# Patient Record
Sex: Female | Born: 1980
Health system: Southern US, Community
[De-identification: ages and names within clinical notes are randomized; demographics above are authoritative.]

## PROBLEM LIST (undated history)

## (undated) ENCOUNTER — Inpatient Hospital Stay (HOSPITAL_COMMUNITY): Payer: Self-pay

## (undated) DIAGNOSIS — T7840XA Allergy, unspecified, initial encounter: Secondary | ICD-10-CM

## (undated) DIAGNOSIS — E079 Disorder of thyroid, unspecified: Secondary | ICD-10-CM

## (undated) DIAGNOSIS — N6029 Fibroadenosis of unspecified breast: Secondary | ICD-10-CM

## (undated) DIAGNOSIS — G43009 Migraine without aura, not intractable, without status migrainosus: Secondary | ICD-10-CM

## (undated) DIAGNOSIS — D249 Benign neoplasm of unspecified breast: Secondary | ICD-10-CM

## (undated) HISTORY — DX: Allergy, unspecified, initial encounter: T78.40XA

## (undated) HISTORY — PX: WISDOM TOOTH EXTRACTION: SHX21

## (undated) HISTORY — DX: Benign neoplasm of unspecified breast: D24.9

## (undated) HISTORY — DX: Fibroadenosis of unspecified breast: N60.29

## (undated) HISTORY — DX: Disorder of thyroid, unspecified: E07.9

## (undated) HISTORY — DX: Migraine without aura, not intractable, without status migrainosus: G43.009

---

## 1999-07-03 ENCOUNTER — Other Ambulatory Visit: Admission: RE | Admit: 1999-07-03 | Discharge: 1999-07-03 | Payer: Self-pay | Admitting: Internal Medicine

## 2000-07-02 ENCOUNTER — Other Ambulatory Visit: Admission: RE | Admit: 2000-07-02 | Discharge: 2000-07-02 | Payer: Self-pay | Admitting: Family Medicine

## 2001-07-11 ENCOUNTER — Other Ambulatory Visit: Admission: RE | Admit: 2001-07-11 | Discharge: 2001-07-11 | Payer: Self-pay | Admitting: Family Medicine

## 2002-06-30 ENCOUNTER — Other Ambulatory Visit: Admission: RE | Admit: 2002-06-30 | Discharge: 2002-06-30 | Payer: Self-pay | Admitting: Internal Medicine

## 2003-07-02 ENCOUNTER — Other Ambulatory Visit: Admission: RE | Admit: 2003-07-02 | Discharge: 2003-07-02 | Payer: Self-pay | Admitting: Internal Medicine

## 2004-10-16 ENCOUNTER — Encounter (INDEPENDENT_AMBULATORY_CARE_PROVIDER_SITE_OTHER): Payer: Self-pay | Admitting: Internal Medicine

## 2004-10-16 LAB — CONVERTED CEMR LAB: Pap Smear: NORMAL

## 2004-11-06 ENCOUNTER — Ambulatory Visit: Payer: Self-pay | Admitting: Family Medicine

## 2004-11-06 ENCOUNTER — Other Ambulatory Visit: Admission: RE | Admit: 2004-11-06 | Discharge: 2004-11-06 | Payer: Self-pay | Admitting: Internal Medicine

## 2005-01-08 ENCOUNTER — Ambulatory Visit: Payer: Self-pay | Admitting: Family Medicine

## 2005-05-05 ENCOUNTER — Encounter: Admission: RE | Admit: 2005-05-05 | Discharge: 2005-05-05 | Payer: Self-pay | Admitting: Unknown Physician Specialty

## 2005-12-17 ENCOUNTER — Encounter (INDEPENDENT_AMBULATORY_CARE_PROVIDER_SITE_OTHER): Payer: Self-pay | Admitting: Internal Medicine

## 2005-12-23 ENCOUNTER — Other Ambulatory Visit: Admission: RE | Admit: 2005-12-23 | Discharge: 2005-12-23 | Payer: Self-pay | Admitting: Family Medicine

## 2005-12-23 ENCOUNTER — Ambulatory Visit: Payer: Self-pay | Admitting: Family Medicine

## 2006-12-17 ENCOUNTER — Encounter (INDEPENDENT_AMBULATORY_CARE_PROVIDER_SITE_OTHER): Payer: Self-pay | Admitting: Internal Medicine

## 2007-01-10 ENCOUNTER — Other Ambulatory Visit: Admission: RE | Admit: 2007-01-10 | Discharge: 2007-01-10 | Payer: Self-pay | Admitting: Family Medicine

## 2007-01-10 ENCOUNTER — Ambulatory Visit: Payer: Self-pay | Admitting: Family Medicine

## 2007-01-10 ENCOUNTER — Encounter (INDEPENDENT_AMBULATORY_CARE_PROVIDER_SITE_OTHER): Payer: Self-pay | Admitting: Internal Medicine

## 2007-03-04 ENCOUNTER — Ambulatory Visit: Payer: Self-pay | Admitting: Family Medicine

## 2007-04-27 ENCOUNTER — Encounter: Payer: Self-pay | Admitting: Internal Medicine

## 2007-04-27 DIAGNOSIS — N6029 Fibroadenosis of unspecified breast: Secondary | ICD-10-CM | POA: Insufficient documentation

## 2007-04-27 DIAGNOSIS — E039 Hypothyroidism, unspecified: Secondary | ICD-10-CM | POA: Insufficient documentation

## 2007-04-27 HISTORY — DX: Fibroadenosis of unspecified breast: N60.29

## 2007-04-29 ENCOUNTER — Ambulatory Visit: Payer: Self-pay | Admitting: Family Medicine

## 2007-04-29 DIAGNOSIS — Z888 Allergy status to other drugs, medicaments and biological substances status: Secondary | ICD-10-CM | POA: Insufficient documentation

## 2007-05-25 ENCOUNTER — Telehealth (INDEPENDENT_AMBULATORY_CARE_PROVIDER_SITE_OTHER): Payer: Self-pay | Admitting: Internal Medicine

## 2007-06-09 ENCOUNTER — Encounter: Payer: Self-pay | Admitting: Family Medicine

## 2007-07-28 ENCOUNTER — Encounter (INDEPENDENT_AMBULATORY_CARE_PROVIDER_SITE_OTHER): Payer: Self-pay | Admitting: Internal Medicine

## 2007-08-26 ENCOUNTER — Ambulatory Visit: Payer: Self-pay | Admitting: Family Medicine

## 2007-09-09 ENCOUNTER — Telehealth (INDEPENDENT_AMBULATORY_CARE_PROVIDER_SITE_OTHER): Payer: Self-pay | Admitting: *Deleted

## 2007-10-25 ENCOUNTER — Encounter: Admission: RE | Admit: 2007-10-25 | Discharge: 2007-10-25 | Payer: Self-pay | Admitting: Unknown Physician Specialty

## 2008-01-19 ENCOUNTER — Encounter (INDEPENDENT_AMBULATORY_CARE_PROVIDER_SITE_OTHER): Payer: Self-pay | Admitting: Internal Medicine

## 2008-01-19 ENCOUNTER — Ambulatory Visit: Payer: Self-pay | Admitting: Family Medicine

## 2008-01-19 ENCOUNTER — Other Ambulatory Visit: Admission: RE | Admit: 2008-01-19 | Discharge: 2008-01-19 | Payer: Self-pay | Admitting: Family Medicine

## 2008-01-19 LAB — CONVERTED CEMR LAB: Pap Smear: NORMAL

## 2008-01-27 ENCOUNTER — Encounter (INDEPENDENT_AMBULATORY_CARE_PROVIDER_SITE_OTHER): Payer: Self-pay | Admitting: Internal Medicine

## 2008-01-27 ENCOUNTER — Encounter (INDEPENDENT_AMBULATORY_CARE_PROVIDER_SITE_OTHER): Payer: Self-pay | Admitting: *Deleted

## 2008-06-22 ENCOUNTER — Encounter (INDEPENDENT_AMBULATORY_CARE_PROVIDER_SITE_OTHER): Payer: Self-pay | Admitting: Internal Medicine

## 2008-06-25 ENCOUNTER — Encounter (INDEPENDENT_AMBULATORY_CARE_PROVIDER_SITE_OTHER): Payer: Self-pay | Admitting: *Deleted

## 2008-06-25 ENCOUNTER — Encounter (INDEPENDENT_AMBULATORY_CARE_PROVIDER_SITE_OTHER): Payer: Self-pay | Admitting: Internal Medicine

## 2008-06-25 DIAGNOSIS — N63 Unspecified lump in unspecified breast: Secondary | ICD-10-CM | POA: Insufficient documentation

## 2008-07-10 ENCOUNTER — Telehealth: Payer: Self-pay | Admitting: Family Medicine

## 2008-10-30 ENCOUNTER — Telehealth (INDEPENDENT_AMBULATORY_CARE_PROVIDER_SITE_OTHER): Payer: Self-pay | Admitting: Internal Medicine

## 2009-01-23 ENCOUNTER — Ambulatory Visit: Payer: Self-pay | Admitting: Family Medicine

## 2009-01-23 ENCOUNTER — Other Ambulatory Visit: Admission: RE | Admit: 2009-01-23 | Discharge: 2009-01-23 | Payer: Self-pay | Admitting: Family Medicine

## 2009-01-23 ENCOUNTER — Encounter (INDEPENDENT_AMBULATORY_CARE_PROVIDER_SITE_OTHER): Payer: Self-pay | Admitting: Internal Medicine

## 2009-01-24 LAB — CONVERTED CEMR LAB
BUN: 11 mg/dL (ref 6–23)
Basophils Absolute: 0 10*3/uL (ref 0.0–0.1)
Basophils Relative: 0 % (ref 0–1)
CO2: 24 meq/L (ref 19–32)
Calcium: 8.8 mg/dL (ref 8.4–10.5)
Eosinophils Absolute: 0.1 10*3/uL (ref 0.0–0.7)
Eosinophils Relative: 2 % (ref 0–5)
Glucose, Bld: 79 mg/dL (ref 70–99)
HCT: 41.8 % (ref 36.0–46.0)
MCHC: 33.5 g/dL (ref 30.0–36.0)
MCV: 93.1 fL (ref 78.0–100.0)
Monocytes Absolute: 0.4 10*3/uL (ref 0.1–1.0)
Monocytes Relative: 7 % (ref 3–12)
Neutro Abs: 3.3 10*3/uL (ref 1.7–7.7)
Platelets: 293 10*3/uL (ref 150–400)
RDW: 13.3 % (ref 11.5–15.5)
TSH: 2.402 microintl units/mL (ref 0.350–4.500)

## 2009-01-29 ENCOUNTER — Encounter (INDEPENDENT_AMBULATORY_CARE_PROVIDER_SITE_OTHER): Payer: Self-pay | Admitting: *Deleted

## 2009-07-03 ENCOUNTER — Ambulatory Visit: Payer: Self-pay | Admitting: Obstetrics & Gynecology

## 2009-07-03 LAB — CONVERTED CEMR LAB: GC Probe Amp, Genital: NEGATIVE

## 2009-07-04 ENCOUNTER — Encounter: Payer: Self-pay | Admitting: Obstetrics & Gynecology

## 2010-01-23 ENCOUNTER — Ambulatory Visit: Payer: Self-pay | Admitting: Family Medicine

## 2010-01-23 LAB — CONVERTED CEMR LAB
TSH: 1.945 microintl units/mL (ref 0.350–4.500)
Vit D, 25-Hydroxy: 17 ng/mL — ABNORMAL LOW (ref 30–89)

## 2010-06-12 ENCOUNTER — Ambulatory Visit: Payer: Self-pay | Admitting: Obstetrics and Gynecology

## 2010-06-12 LAB — CONVERTED CEMR LAB: GC Probe Amp, Genital: NEGATIVE

## 2010-06-13 ENCOUNTER — Encounter: Payer: Self-pay | Admitting: Obstetrics and Gynecology

## 2010-06-13 LAB — CONVERTED CEMR LAB
Clue Cells Wet Prep HPF POC: NONE SEEN
Trich, Wet Prep: NONE SEEN

## 2010-06-18 ENCOUNTER — Ambulatory Visit (HOSPITAL_COMMUNITY): Admission: RE | Admit: 2010-06-18 | Discharge: 2010-06-18 | Payer: Self-pay | Admitting: Family Medicine

## 2010-06-23 ENCOUNTER — Encounter: Payer: Self-pay | Admitting: Family Medicine

## 2010-06-23 ENCOUNTER — Ambulatory Visit: Payer: Self-pay | Admitting: Obstetrics and Gynecology

## 2010-06-23 LAB — CONVERTED CEMR LAB
Eosinophils Absolute: 0.1 10*3/uL (ref 0.0–0.7)
Eosinophils Relative: 2 % (ref 0–5)
HCT: 41.5 % (ref 36.0–46.0)
Hemoglobin: 13.4 g/dL (ref 12.0–15.0)
Hepatitis B Surface Ag: NEGATIVE
Lymphocytes Relative: 25 % (ref 12–46)
Lymphs Abs: 1.8 10*3/uL (ref 0.7–4.0)
MCV: 97.9 fL (ref 78.0–100.0)
Monocytes Absolute: 0.4 10*3/uL (ref 0.1–1.0)
Platelets: 233 10*3/uL (ref 150–400)
RDW: 14.1 % (ref 11.5–15.5)
Rh Type: NEGATIVE
WBC: 7.4 10*3/uL (ref 4.0–10.5)

## 2010-07-10 ENCOUNTER — Encounter: Payer: Self-pay | Admitting: Family Medicine

## 2010-07-10 ENCOUNTER — Ambulatory Visit: Payer: Self-pay | Admitting: Obstetrics & Gynecology

## 2010-07-10 LAB — CONVERTED CEMR LAB
Chlamydia, Swab/Urine, PCR: NEGATIVE
TSH: 2.01 microintl units/mL (ref 0.350–4.500)

## 2010-08-07 ENCOUNTER — Ambulatory Visit: Payer: Self-pay | Admitting: Obstetrics & Gynecology

## 2010-08-07 ENCOUNTER — Encounter: Payer: Self-pay | Admitting: Family Medicine

## 2010-08-25 ENCOUNTER — Ambulatory Visit (HOSPITAL_COMMUNITY): Admission: RE | Admit: 2010-08-25 | Discharge: 2010-08-25 | Payer: Self-pay | Admitting: Obstetrics & Gynecology

## 2010-09-11 ENCOUNTER — Ambulatory Visit: Payer: Self-pay | Admitting: Obstetrics & Gynecology

## 2010-09-25 ENCOUNTER — Ambulatory Visit: Payer: Self-pay | Admitting: Obstetrics & Gynecology

## 2010-09-26 ENCOUNTER — Encounter: Payer: Self-pay | Admitting: Family Medicine

## 2010-10-14 ENCOUNTER — Ambulatory Visit: Payer: Self-pay | Admitting: Obstetrics & Gynecology

## 2010-11-04 ENCOUNTER — Ambulatory Visit: Payer: Self-pay | Admitting: Family Medicine

## 2010-11-04 ENCOUNTER — Encounter: Payer: Self-pay | Admitting: Family Medicine

## 2010-11-04 LAB — CONVERTED CEMR LAB
HIV: NONREACTIVE
MCHC: 32 g/dL (ref 30.0–36.0)
MCV: 96.2 fL (ref 78.0–100.0)
Platelets: 296 10*3/uL (ref 150–400)
RBC: 4.16 M/uL (ref 3.87–5.11)
RDW: 13.4 % (ref 11.5–15.5)
TSH: 2.42 microintl units/mL (ref 0.350–4.500)

## 2010-11-06 ENCOUNTER — Ambulatory Visit: Payer: Self-pay | Admitting: Obstetrics & Gynecology

## 2010-11-20 ENCOUNTER — Ambulatory Visit
Admission: RE | Admit: 2010-11-20 | Discharge: 2010-11-20 | Payer: Self-pay | Source: Home / Self Care | Attending: Obstetrics & Gynecology | Admitting: Obstetrics & Gynecology

## 2010-11-27 ENCOUNTER — Ambulatory Visit
Admission: RE | Admit: 2010-11-27 | Discharge: 2010-11-27 | Payer: Self-pay | Source: Home / Self Care | Attending: Obstetrics & Gynecology | Admitting: Obstetrics & Gynecology

## 2010-12-11 ENCOUNTER — Ambulatory Visit
Admission: RE | Admit: 2010-12-11 | Discharge: 2010-12-11 | Payer: Self-pay | Source: Home / Self Care | Attending: Obstetrics & Gynecology | Admitting: Obstetrics & Gynecology

## 2010-12-25 ENCOUNTER — Encounter: Payer: Self-pay | Admitting: Family Medicine

## 2010-12-25 ENCOUNTER — Encounter: Payer: Self-pay | Admitting: Obstetrics & Gynecology

## 2010-12-25 ENCOUNTER — Encounter: Payer: BC Managed Care – PPO | Admitting: Obstetrics & Gynecology

## 2010-12-25 DIAGNOSIS — Z3480 Encounter for supervision of other normal pregnancy, unspecified trimester: Secondary | ICD-10-CM

## 2010-12-25 DIAGNOSIS — O99280 Endocrine, nutritional and metabolic diseases complicating pregnancy, unspecified trimester: Secondary | ICD-10-CM

## 2010-12-25 DIAGNOSIS — E079 Disorder of thyroid, unspecified: Secondary | ICD-10-CM

## 2010-12-26 ENCOUNTER — Encounter: Payer: Self-pay | Admitting: Family Medicine

## 2011-01-01 ENCOUNTER — Encounter: Payer: BC Managed Care – PPO | Admitting: Obstetrics & Gynecology

## 2011-01-01 DIAGNOSIS — Z3480 Encounter for supervision of other normal pregnancy, unspecified trimester: Secondary | ICD-10-CM

## 2011-01-07 ENCOUNTER — Encounter: Payer: BC Managed Care – PPO | Admitting: Obstetrics & Gynecology

## 2011-01-07 DIAGNOSIS — Z3400 Encounter for supervision of normal first pregnancy, unspecified trimester: Secondary | ICD-10-CM

## 2011-01-14 ENCOUNTER — Encounter: Payer: BC Managed Care – PPO | Admitting: Obstetrics & Gynecology

## 2011-01-14 DIAGNOSIS — Z3400 Encounter for supervision of normal first pregnancy, unspecified trimester: Secondary | ICD-10-CM

## 2011-01-14 DIAGNOSIS — O368190 Decreased fetal movements, unspecified trimester, not applicable or unspecified: Secondary | ICD-10-CM

## 2011-01-22 ENCOUNTER — Encounter: Payer: BC Managed Care – PPO | Admitting: Obstetrics & Gynecology

## 2011-01-22 DIAGNOSIS — Z3400 Encounter for supervision of normal first pregnancy, unspecified trimester: Secondary | ICD-10-CM

## 2011-01-26 ENCOUNTER — Encounter: Payer: BC Managed Care – PPO | Admitting: Obstetrics & Gynecology

## 2011-01-26 ENCOUNTER — Inpatient Hospital Stay (HOSPITAL_COMMUNITY)
Admission: AD | Admit: 2011-01-26 | Discharge: 2011-01-30 | DRG: 371 | Disposition: A | Discharge status: Home / Self Care | Payer: BC Managed Care – PPO | Source: Ambulatory Visit | Attending: Family Medicine | Admitting: Family Medicine

## 2011-01-26 DIAGNOSIS — O99892 Other specified diseases and conditions complicating childbirth: Secondary | ICD-10-CM | POA: Diagnosis present

## 2011-01-26 DIAGNOSIS — Z22330 Carrier of Group B streptococcus: Secondary | ICD-10-CM

## 2011-01-26 DIAGNOSIS — O4290 Premature rupture of membranes, unspecified as to length of time between rupture and onset of labor, unspecified weeks of gestation: Secondary | ICD-10-CM | POA: Diagnosis present

## 2011-01-26 LAB — CBC
HCT: 38.7 % (ref 36.0–46.0)
Hemoglobin: 12.9 g/dL (ref 12.0–15.0)
MCH: 30.3 pg (ref 26.0–34.0)
MCHC: 33.3 g/dL (ref 30.0–36.0)
MCV: 90.8 fL (ref 78.0–100.0)
RDW: 14.3 % (ref 11.5–15.5)

## 2011-01-26 LAB — COMPREHENSIVE METABOLIC PANEL
ALT: 15 U/L (ref 0–35)
BUN: 7 mg/dL (ref 6–23)
CO2: 21 mEq/L (ref 19–32)
Calcium: 8.4 mg/dL (ref 8.4–10.5)
Creatinine, Ser: 0.6 mg/dL (ref 0.4–1.2)
GFR calc non Af Amer: >60 mL/min (ref >60)
Glucose, Bld: 93 mg/dL (ref 70–99)
Sodium: 134 mEq/L — ABNORMAL LOW (ref 135–145)
Total Protein: 6.6 g/dL (ref 6.0–8.3)

## 2011-01-26 LAB — RPR: RPR Ser Ql: NONREACTIVE

## 2011-01-27 DIAGNOSIS — O99892 Other specified diseases and conditions complicating childbirth: Secondary | ICD-10-CM

## 2011-01-27 DIAGNOSIS — O4290 Premature rupture of membranes, unspecified as to length of time between rupture and onset of labor, unspecified weeks of gestation: Secondary | ICD-10-CM

## 2011-01-27 DIAGNOSIS — O9989 Other specified diseases and conditions complicating pregnancy, childbirth and the puerperium: Secondary | ICD-10-CM

## 2011-01-28 LAB — CBC
Hemoglobin: 10.2 g/dL — ABNORMAL LOW (ref 12.0–15.0)
MCHC: 32.5 g/dL (ref 30.0–36.0)
RDW: 15 % (ref 11.5–15.5)
WBC: 10.8 10*3/uL — ABNORMAL HIGH (ref 4.0–10.5)

## 2011-01-29 LAB — RH IMMUNE GLOB WKUP(>/=20WKS)(NOT WOMEN'S HOSP)
Fetal Screen: NEGATIVE
Unit division: 0

## 2011-02-03 NOTE — Op Note (Signed)
NAME:  CHIANNA, SPIRITO              ACCOUNT NO.:  [id number]  MEDICAL RECORD NO.:  [id number]           PATIENT TYPE:  I  LOCATION:  9126                          FACILITY:  [facility]  PHYSICIAN:  Catalina Antigua, MD     DATE OF BIRTH:  1981/05/12  DATE OF PROCEDURE: DATE OF DISCHARGE:                              OPERATIVE REPORT   PREOPERATIVE DIAGNOSIS:  A 30 year old gravida 1 at 40 weeks and 2 days with premature rupture of membranes, for primary C-section secondary to failure to dilate.  POSTOPERATIVE DIAGNOSIS:  A 30 year old gravida 1 at 40 weeks and 2 days with premature rupture of membranes, for primary C-section secondary to failure to dilate.  PROCEDURE:  Primary low transverse C-section.  SURGEON:  Catalina Antigua, MD.  ASSISTANT:  None.  ANESTHESIA:  Epidural.  IV FLUIDS:  2000 mL.  URINE OUTPUT:  300 mL.  ESTIMATED BLOOD LOSS:  600 mL.  COMPLICATIONS:  None.  FINDINGS:  A viable female infant with Apgars 9 and 9, weight of 8 pounds 5 ounces; grossly normal uterus, tubes, and ovaries x2.  Specimen collected was placenta and was sent to [facility] and [facility].  After informed consent was obtained, the patient was taken to the operating room where anesthesia was induced and found to be adequate. The patient was placed in dorsal supine position and prepped and draped in usual sterile fashion.  A Pfannenstiel skin incision was made with a scalpel and carried down the underlying layer of fascia with the Bovie. The fascia was incised in the midline.  Incision extended laterally with Mayo scissors.  The inferior aspect of the fascial incision was grasped with Kocher clamps, tented up, and the underlying rectus muscle dissected off.  Attention was then to the superior aspect of fascial incision which in similar fashion was grasped, tented up with Kocher clamps, and the underlying rectus muscle dissected off.  The rectus muscle was separated in the midline.  Peritoneum identified,  tented up, and entered bluntly.  This incision was extended superiorly and inferiorly with good visualization of the bladder.  The Alexis self- retaining retractor was introduced into the abdominal cavity.  The vesicouterine peritoneum was identified, tented up, and entered sharply with Metzenbaum scissors.  This incision was extended laterally and a bladder flap created digitally.  The lower uterine segment was incised in a transverse fashion with a scalpel.  This incision was extended laterally with the bandage scissors.  The infant was delivered atraumatically.  The nose and mouth were bulb suctioned.  Cord clamped and cut.  The infant was handed to awaiting pediatrician.  The placenta was delivered via gentle uterine massage.  The uterus was cleared of all clots and debris.  The uterine incision was repaired with 0 Vicryl in a running locked fashion.  Excellent hemostasis was noted.  The gutters were cleared of all clots and debris.  Copious irrigation of the abdomen was then performed.  The fascia was reapproximated with 0 Vicryl, and the skin was closed with staples.  The patient tolerated the procedure well.  Sponge, lap, and needle counts were correct x2.  Catalina Antigua, MD     PC/MEDQ  D:  01/27/2011  T:  01/27/2011  Job:  [id number]  Electronically Signed by Catalina Antigua  on 02/03/2011 02:06:31 PM

## 2011-02-14 NOTE — Discharge Summary (Signed)
  NAMEJESSE, HIRST              ACCOUNT NO.:  [id number]  MEDICAL RECORD NO.:  [id number]           PATIENT TYPE:  I  LOCATION:  9126                          FACILITY:  [facility]  PHYSICIAN:  Italia Wolfert S. Shawnie Pons, M.D.   DATE OF BIRTH:  Mar 17, 1981  DATE OF ADMISSION:  01/26/2011 DATE OF DISCHARGE:  01/30/2011                              DISCHARGE SUMMARY   DISCHARGE DIAGNOSES: 1. Pregnancy status post low transverse cesarean section delivery. 2. Failure to dilate the cervix.  DISCHARGE MEDICATIONS: 1. Percocet 5/325 mg p.o. q.6 p.r.n. pain. 2. Ibuprofen 600 mg p.o. q.6 p.r.n. pain. 3. Docusate 100 mg p.o. b.i.d. p.r.n. constipation. 4. Prenatal vitamin.  LABORATORY DATA:  Pertinent lab values, on January 26, 2011, CBC white blood cell count 10.3, hemoglobin 12.9, hematocrit 38.7, and platelets 251.  Complete metabolic panel, sodium 139, potassium 3.9, chloride 105, CO2 of 21, glucose 93, BUN 7, creatinine 0.67, total bilirubin 0.7, alkaline phosphatase 102, AST 22, ALT 15, total protein 6.6, albumin 2.7, calcium 8.4, RPR was nonreactive.  January 28, 2011, CBC white blood cell count 10.8, hemoglobin 10.2, hematocrit 31.4, platelets 183.  PROCEDURES:  Low transverse cesarean section.  BRIEF HOSPITAL COURSE:  Jan Walters is a 30 year old G1 who presented with rupture of membranes at 41 weeks.  The patient was admitted to birthing suites.   However, her cervix failed to dialate, so she was taken to the operating room for low transverse cesarean section.  There were no complications to the C-section.  The patient was afebrile with stable vital signs and stable hemoglobin.  On postop days 1, 2, and 3, her pain was well-controlled with Percocet and ibuprofen.  She was breast-feeding her baby.  She opted not to have any immediate birth control but plans to have a Mirena placed at her postpartum visit.  On the day of discharge, the patient's staples were removed.  Her incision was dry and  no drainage, no bleeding.  Steri-Strips were placed after removal of staples.  The incision was healing well.  FOLLOWUP ISSUES/RECOMMENDATIONS:  The patient will follow up at [facility] in 6 weeks for her postpartum visit.  DISCHARGE CONDITION:  The patient was discharged home in stable medical condition.    ______________________________ Ardyth Gal, MD   ______________________________ Shelbie Proctor. Shawnie Pons, M.D.    CR/MEDQ  D:  01/30/2011  T:  01/31/2011  Job:  [id number]  Electronically Signed by Ardyth Gal MD on 02/11/2011 04:30:59 PM Electronically Signed by Tinnie Gens M.D. on 02/14/2011 06:38:08 PM

## 2011-03-05 ENCOUNTER — Ambulatory Visit: Payer: BC Managed Care – PPO | Admitting: Obstetrics & Gynecology

## 2011-03-06 NOTE — Assessment & Plan Note (Signed)
NAME:  Catherine Mckenzie, Catherine Mckenzie NO.:  [id number]  MEDICAL RECORD NO.:  [id number]           PATIENT TYPE:  I  LOCATION:  [facility] at [facility]          FACILITY:  [facility]  PHYSICIAN:  Tinnie Gens, MD        DATE OF BIRTH:  1981/07/16  DATE OF SERVICE:  03/05/2011                                 CLINIC NOTE  CHIEF COMPLAINT:  Postpartum check.  HISTORY OF PRESENT ILLNESS:  The patient is a 30 year old gravida 1, para 1 who ended up with a cesarean delivery secondary to right occiput posterior arrest of dilation of 8 pounds 5 ounces female fetus, who at this time is well.  She is breast-feeding that is going well.  She reports feeling better over the last week, has recovery from her cesarean delivery.  She reports that her mood is really good.  She has no issues there.  She reports painful breastfeeding, feels like a knife stabbing her when the baby latches on.  She has no thrush.  She was seen by the doctor yesterday.  She has no other complaints.  She has not resumed sexual activity.  PHYSICAL EXAMINATION:  VITAL SIGNS:  Her vitals are as noted in the chart. GENERAL:  She is a well-developed, well-nourished female, in no acute distress. ABDOMEN:  Soft, nontender, nondistended.  Incision is well healed. BREASTS:  Revealed crack pink nipples consistent with deep fungal infection. GU:  Normal external female genitalia.  BUS is normal.  Vagina is pink and rugated.  Cervix is without lesion.  The uterus is small and well involuted, nontender.  IMPRESSION: 1. Postpartum check. 2. Status post Cesarean section. 3. Candidal breast infection.  PLAN: 1. Continue breast-feeding.  We will put her on Diflucan 150 mg p.o.     daily. 2. Start wearing breast shells again as this will probably help her     nipple stay dry. 3. The patient desires mini-pill for birth control.  We will start     that one p.o. daily x1 pack.  The patient's Pap is due at her next     yearly visit.        ______________________________ Tinnie Gens, MD    TP/MEDQ  D:  03/05/2011  T:  03/05/2011  Job:  [id number]

## 2011-03-31 NOTE — Assessment & Plan Note (Signed)
NAME:  Catherine Mckenzie, Catherine Mckenzie              ACCOUNT NO.:  [id number]   MEDICAL RECORD NO.:  [id number]          PATIENT TYPE:  POB   LOCATION:  [facility] at [facility]         FACILITY:  [facility]   PHYSICIAN:  Tinnie Gens, MD        DATE OF BIRTH:  [birth year]   DATE OF SERVICE:  01/23/2010                                  CLINIC NOTE   CHIEF COMPLAINT:  Physical exam.   HISTORY OF PRESENT ILLNESS:  The patient is a 30 year old nullipara who  comes in today for her yearly exam.  She was last seen in August of last  year with some pelvic pain that has since resolved mostly.  The patient  is off her birth control just this past month, so that she could try to  achieve pregnancy.  She is on prenatal vitamins and taking them  regularly.  She has been on birth control pills for approximately 11  years.  Before that, she had a lot of problems with migraine headaches  and painful cycles.  Since she has not had a regular cycle yet to know  how she will do since stopping her birth control.   PAST MEDICAL HISTORY:  Significant for hypothyroidism, migraine  headaches, and seasonal allergies.   PAST SURGICAL HISTORY:  Wisdom teeth removal.   MEDICATIONS:  Allergy shots weekly, Synthroid 25 mcg p.o. daily, Zyrtec  10 mg one p.o. daily.  She varies between Rhinocort Aqua and Omnaris,  both nasal steroids.  She does not take them simultaneously and prenatal  vitamins.   ALLERGIES:  BENZO-RING GROUPS, anything that contains that.  She has no  other known food allergies.  She is not allergic to latex.   OBSTETRICAL HISTORY:  She is G0.  She has regular cycles.  She has a  history of a left fibroadenoma in the left breast that is followed by  ultrasound.  She sees Dr. Yolanda Bonine for this and was last released to 2-  year followup.   SOCIAL HISTORY:  She is married.  She works as a [employer].  She  does not smoke.  She has occasional alcohol use.  There is no illicit  drugs.   FAMILY HISTORY:  1.  Breast cancer in paternal grandmother.  2. Pancreatic cancer, maternal grandmother.  3. Paternal grandfather with prostate cancer.  No other breast or      ovarian or uterine cancers noted.  Family history is notable for      diabetes, heart disease, and hypertension.   A 14-point review of system reviewed.  She denies headache, vision  changes, shortness of breath, chest pain, nausea, vomiting, diarrhea,  constipation, dysuria, blood in her urine, blood in her stool, swelling,  or significant headaches.   PHYSICAL EXAMINATION:  VITAL SIGNS:  Listed in the chart.  GENERAL:  She is a well-nourished, well-nourished female in no acute  distress.  HEENT:  Normocephalic, atraumatic.  Sclerae anicteric.  NECK:  Supple.  Normal thyroid.  LUNGS:  Clear bilaterally.  CV:  Regular rate and rhythm without gallops, rubs, or murmurs.  ABDOMEN:  Soft, nontender, nondistended.  EXTREMITIES:  No cyanosis, clubbing, or edema.  GU:  Normal  external female genitalia.  BUS is normal.  Vagina is pink  and rugated.  Cervix is nulliparous without lesion.  Uterus is small,  midposition, normal size.  No adnexal mass or tenderness.  BREASTS:  Symmetric with everted nipples.  There is a 2 x 1.5 cm smooth,  mobile, firm mass in the 5 o'clock position on the left breast.   IMPRESSION:  1. Gynecologic exam.  2. Wanting to achieve pregnancy.   PLAN:  1. Continue prenatal vitamins.  Pap smear today.  2. Check TSH.  3. We will check a vitamin D level as she has a strong family history      of this.           ______________________________  Tinnie Gens, MD     TP/MEDQ  D:  01/23/2010  T:  01/24/2010  Job:  [id number]

## 2011-03-31 NOTE — Assessment & Plan Note (Signed)
NAMECARISHA, KANTOR              ACCOUNT NO.:  [id number]   MEDICAL RECORD NO.:  [id number]          PATIENT TYPE:  POB   LOCATION:  [facility] at [facility]         FACILITY:  [facility]   PHYSICIAN:  Jaynie Collins, MD     DATE OF BIRTH:  Jul 27, 1981   DATE OF SERVICE:  07/03/2009                                  CLINIC NOTE   CHIEF COMPLAINT:  Pelvic pain.   HISTORY OF PRESENT ILLNESS:  The patient is a 30 year old gravida 0 with  her last menstrual period of June 18, 2009, who is here for evaluation  of pelvic pain and also to establish a relationship with our practice.  The patient does report having pelvic pain a couple of weeks ago and had  dyspareunia for 2 weeks.  It is currently subsided, but she does want to  be checked.  She denies any abnormal vaginal discharge or any other  gynecologic concerns.  Her last Pap smear was in February 2010 and she  has no other concerns.   PAST OB/GYN HISTORY:  Gravida 0, menarche at age 37, regular menstrual  cycles, less than 28 days, 20 days between cycles, her periods last for  5 days, and she has moderate pain with the bleeding, but her periods are  regulated of being on birth control.  She is on Necon 1/35 currently.  The patient denies any history of cervical dysplasia.  She does have a  history of fibroadenoma, which has been followed by breast ultrasound,  the last one was in July 2009.   PAST MEDICAL HISTORY:  Fibroadenoma, hypothyroidism, migraines, and  seasonal allergies.   PAST SURGICAL HISTORY:  Wisdom teeth removal.   MEDICATIONS:  1. Allergy shots.  2. Necon 1/35.  3. Synthroid 25 mcg daily.  4. Zyrtec.  5. Rhinocort.  6. Omnaris.   ALLERGIES:  Benzo and environmental allergens, which cause swelling and  redness.   SOCIAL HISTORY:  The patient lives with her husband.  She works as a  Best boy.  She does not smoke.  She occasionally has an  alcoholic drink.  She does not have any illicit drugs.  She denies  any  past or current history of sexual or physical abuse.   FAMILY HISTORY:  Remarkable for paternal grandmother with breast cancer.  Maternal grandmother with pancreatic cancer.  Paternal grandfather with  prostate cancer.  She denies any other gynecologic cancers.  There is  also an extensive family history of diabetes, heart disease, and high  blood pressure.   REVIEW OF SYSTEMS:  Notable for muscle aches, weight gain, nausea,  vomiting, and pain with intercourse.   PHYSICAL EXAMINATION:  VITAL SIGNS:  Pulse is 81, blood pressure 120/80,  weight 191 pounds, height 5 feet 7 inches.  GENERAL:  No apparent distress.  ABDOMEN:  Soft, nontender, nondistended.  PELVIC:  Normal external female genitalia.  Pink and well rugated  vagina.  The patient does have some brown discharge in vault, which a  sample for wet prep was obtained.  She has a nulliparous cervical  contour.  No abnormal discharge was seen.  However,  gonorrhea and  Chlamydia probe was obtained, and on obtaining  this sample with a swab,  her cervix was noted to be very friable and had a small amount of bleed  that needed to be cleaned up.  On bimanual exam, the patient has a small  mobile uterus, nontender, nontender adnexa bilaterally, and no abnormal  adnexa masses palpated.   ASSESSMENT AND PLAN:  The patient is a 30 year old here for evaluation  of pelvic pain.  The patient's exam only notable for possible  cervicitis.  We will follow up the wet prep and gonorrhea and Chlamydia  results.  No abnormal anatomy findings were noted.  The patient was told  that if this recurs, then a more extensive evaluation will be done for  her pelvic pain.  In the meantime, we will follow up on this gonorrhea  and  Chlamydia probe and the wet prep to rule out any inflammation or  infection that could be causing her pain.  The patient is not due for  another Pap smear until February 2011, and she was told at that time she  will be  contacted with the results of evaluation today and to call back  for any further gynecologic concerns.           ______________________________  Jaynie Collins, MD     UA/MEDQ  D:  07/03/2009  T:  07/03/2009  Job:  [id number]

## 2011-12-11 ENCOUNTER — Other Ambulatory Visit: Payer: Self-pay | Admitting: Obstetrics & Gynecology

## 2012-03-29 ENCOUNTER — Encounter: Payer: Self-pay | Admitting: Family Medicine

## 2012-03-29 ENCOUNTER — Ambulatory Visit (INDEPENDENT_AMBULATORY_CARE_PROVIDER_SITE_OTHER): Payer: BC Managed Care – PPO | Admitting: Family Medicine

## 2012-03-29 VITALS — BP 124/79 | HR 82 | Ht 67.0 in | Wt 207.0 lb

## 2012-03-29 DIAGNOSIS — Z01419 Encounter for gynecological examination (general) (routine) without abnormal findings: Secondary | ICD-10-CM

## 2012-03-29 DIAGNOSIS — G43009 Migraine without aura, not intractable, without status migrainosus: Secondary | ICD-10-CM | POA: Insufficient documentation

## 2012-03-29 DIAGNOSIS — Z1151 Encounter for screening for human papillomavirus (HPV): Secondary | ICD-10-CM

## 2012-03-29 DIAGNOSIS — Z309 Encounter for contraceptive management, unspecified: Secondary | ICD-10-CM

## 2012-03-29 DIAGNOSIS — Z124 Encounter for screening for malignant neoplasm of cervix: Secondary | ICD-10-CM

## 2012-03-29 MED ORDER — NORETHINDRONE-ETH ESTRADIOL 0.4-35 MG-MCG PO TABS: 1.0000 | ORAL_TABLET | Freq: Every day | ORAL | Status: DC

## 2012-03-29 NOTE — Patient Instructions (Signed)
Preventive Care for Adults, Female A healthy lifestyle and preventive care can promote health and wellness. Preventive health guidelines for women include the following key practices.  A routine yearly physical is a good way to check with your caregiver about your health and preventive screening. It is a chance to share any concerns and updates on your health, and to receive a thorough exam.   Visit your dentist for a routine exam and preventive care every 6 months. Brush your teeth twice a day and floss once a day. Good oral hygiene prevents tooth decay and gum disease.   The frequency of eye exams is based on your age, health, family medical history, use of contact lenses, and other factors. Follow your caregiver's recommendations for frequency of eye exams.   Eat a healthy diet. Foods like vegetables, fruits, whole grains, low-fat dairy products, and lean protein foods contain the nutrients you need without too many calories. Decrease your intake of foods high in solid fats, added sugars, and salt. Eat the right amount of calories for you.Get information about a proper diet from your caregiver, if necessary.   Regular physical exercise is one of the most important things you can do for your health. Most adults should get at least 150 minutes of moderate-intensity exercise (any activity that increases your heart rate and causes you to sweat) each week. In addition, most adults need muscle-strengthening exercises on 2 or more days a week.   Maintain a healthy weight. The body mass index (BMI) is a screening tool to identify possible weight problems. It provides an estimate of body fat based on height and weight. Your caregiver can help determine your BMI, and can help you achieve or maintain a healthy weight.For adults 20 years and older:   A BMI below 18.5 is considered underweight.   A BMI of 18.5 to 24.9 is normal.   A BMI of 25 to 29.9 is considered overweight.   A BMI of 30 and above is  considered obese.   Maintain normal blood lipids and cholesterol levels by exercising and minimizing your intake of saturated fat. Eat a balanced diet with plenty of fruit and vegetables. Blood tests for lipids and cholesterol should begin at age 20 and be repeated every 5 years. If your lipid or cholesterol levels are high, you are over 50, or you are at high risk for heart disease, you may need your cholesterol levels checked more frequently.Ongoing high lipid and cholesterol levels should be treated with medicines if diet and exercise are not effective.   If you smoke, find out from your caregiver how to quit. If you do not use tobacco, do not start.   If you are pregnant, do not drink alcohol. If you are breastfeeding, be very cautious about drinking alcohol. If you are not pregnant and choose to drink alcohol, do not exceed 1 drink per day. One drink is considered to be 12 ounces (355 mL) of beer, 5 ounces (148 mL) of wine, or 1.5 ounces (44 mL) of liquor.   Avoid use of street drugs. Do not share needles with anyone. Ask for help if you need support or instructions about stopping the use of drugs.   High blood pressure causes heart disease and increases the risk of stroke. Your blood pressure should be checked at least every 1 to 2 years. Ongoing high blood pressure should be treated with medicines if weight loss and exercise are not effective.   If you are 55 to 33   years old, ask your caregiver if you should take aspirin to prevent strokes.   Diabetes screening involves taking a blood sample to check your fasting blood sugar level. This should be done once every 3 years, after age 45, if you are within normal weight and without risk factors for diabetes. Testing should be considered at a younger age or be carried out more frequently if you are overweight and have at least 1 risk factor for diabetes.   Breast cancer screening is essential preventive care for women. You should practice "breast  self-awareness." This means understanding the normal appearance and feel of your breasts and may include breast self-examination. Any changes detected, no matter how small, should be reported to a caregiver. Women in their 20s and 30s should have a clinical breast exam (CBE) by a caregiver as part of a regular health exam every 1 to 3 years. After age 40, women should have a CBE every year. Starting at age 40, women should consider having a mammography (breast X-ray test) every year. Women who have a family history of breast cancer should talk to their caregiver about genetic screening. Women at a high risk of breast cancer should talk to their caregivers about having magnetic resonance imaging (MRI) and a mammography every year.   The Pap test is a screening test for cervical cancer. A Pap test can show cell changes on the cervix that might become cervical cancer if left untreated. A Pap test is a procedure in which cells are obtained and examined from the lower end of the uterus (cervix).   Women should have a Pap test starting at age 21.   Between ages 21 and 29, Pap tests should be repeated every 2 years.   Beginning at age 30, you should have a Pap test every 3 years as long as the past 3 Pap tests have been normal.   Some women have medical problems that increase the chance of getting cervical cancer. Talk to your caregiver about these problems. It is especially important to talk to your caregiver if a new problem develops soon after your last Pap test. In these cases, your caregiver may recommend more frequent screening and Pap tests.   The above recommendations are the same for women who have or have not gotten the vaccine for human papillomavirus (HPV).   If you had a hysterectomy for a problem that was not cancer or a condition that could lead to cancer, then you no longer need Pap tests. Even if you no longer need a Pap test, a regular exam is a good idea to make sure no other problems are  starting.   If you are between ages 65 and 70, and you have had normal Pap tests going back 10 years, you no longer need Pap tests. Even if you no longer need a Pap test, a regular exam is a good idea to make sure no other problems are starting.   If you have had past treatment for cervical cancer or a condition that could lead to cancer, you need Pap tests and screening for cancer for at least 20 years after your treatment.   If Pap tests have been discontinued, risk factors (such as a new sexual partner) need to be reassessed to determine if screening should be resumed.   The HPV test is an additional test that may be used for cervical cancer screening. The HPV test looks for the virus that can cause the cell changes on the cervix.   The cells collected during the Pap test can be tested for HPV. The HPV test could be used to screen women aged 30 years and older, and should be used in women of any age who have unclear Pap test results. After the age of 30, women should have HPV testing at the same frequency as a Pap test.   Colorectal cancer can be detected and often prevented. Most routine colorectal cancer screening begins at the age of 50 and continues through age 75. However, your caregiver may recommend screening at an earlier age if you have risk factors for colon cancer. On a yearly basis, your caregiver may provide home test kits to check for hidden blood in the stool. Use of a small camera at the end of a tube, to directly examine the colon (sigmoidoscopy or colonoscopy), can detect the earliest forms of colorectal cancer. Talk to your caregiver about this at age 50, when routine screening begins. Direct examination of the colon should be repeated every 5 to 10 years through age 75, unless early forms of pre-cancerous polyps or small growths are found.   Hepatitis C blood testing is recommended for all people born from 1945 through 1965 and any individual with known risks for hepatitis C.    Practice safe sex. Use condoms and avoid high-risk sexual practices to reduce the spread of sexually transmitted infections (STIs). STIs include gonorrhea, chlamydia, syphilis, trichomonas, herpes, HPV, and human immunodeficiency virus (HIV). Herpes, HIV, and HPV are viral illnesses that have no cure. They can result in disability, cancer, and death. Sexually active women aged 25 and younger should be checked for chlamydia. Older women with new or multiple partners should also be tested for chlamydia. Testing for other STIs is recommended if you are sexually active and at increased risk.   Osteoporosis is a disease in which the bones lose minerals and strength with aging. This can result in serious bone fractures. The risk of osteoporosis can be identified using a bone density scan. Women ages 65 and over and women at risk for fractures or osteoporosis should discuss screening with their caregivers. Ask your caregiver whether you should take a calcium supplement or vitamin D to reduce the rate of osteoporosis.   Menopause can be associated with physical symptoms and risks. Hormone replacement therapy is available to decrease symptoms and risks. You should talk to your caregiver about whether hormone replacement therapy is right for you.   Use sunscreen with sun protection factor (SPF) of 30 or more. Apply sunscreen liberally and repeatedly throughout the day. You should seek shade when your shadow is shorter than you. Protect yourself by wearing long sleeves, pants, a wide-brimmed hat, and sunglasses year round, whenever you are outdoors.   Once a month, do a whole body skin exam, using a mirror to look at the skin on your back. Notify your caregiver of new moles, moles that have irregular borders, moles that are larger than a pencil eraser, or moles that have changed in shape or color.   Stay current with required immunizations.   Influenza. You need a dose every fall (or winter). The composition of  the flu vaccine changes each year, so being vaccinated once is not enough.   Pneumococcal polysaccharide. You need 1 to 2 doses if you smoke cigarettes or if you have certain chronic medical conditions. You need 1 dose at age 65 (or older) if you have never been vaccinated.   Tetanus, diphtheria, pertussis (Tdap, Td). Get 1 dose of   Tdap vaccine if you are younger than age 65, are over 65 and have contact with an infant, are a healthcare worker, are pregnant, or simply want to be protected from whooping cough. After that, you need a Td booster dose every 10 years. Consult your caregiver if you have not had at least 3 tetanus and diphtheria-containing shots sometime in your life or have a deep or dirty wound.   HPV. You need this vaccine if you are a woman age 26 or younger. The vaccine is given in 3 doses over 6 months.   Measles, mumps, rubella (MMR). You need at least 1 dose of MMR if you were born in 1957 or later. You may also need a second dose.   Meningococcal. If you are age 19 to 21 and a first-year college student living in a residence hall, or have one of several medical conditions, you need to get vaccinated against meningococcal disease. You may also need additional booster doses.   Zoster (shingles). If you are age 60 or older, you should get this vaccine.   Varicella (chickenpox). If you have never had chickenpox or you were vaccinated but received only 1 dose, talk to your caregiver to find out if you need this vaccine.   Hepatitis A. You need this vaccine if you have a specific risk factor for hepatitis A virus infection or you simply wish to be protected from this disease. The vaccine is usually given as 2 doses, 6 to 18 months apart.   Hepatitis B. You need this vaccine if you have a specific risk factor for hepatitis B virus infection or you simply wish to be protected from this disease. The vaccine is given in 3 doses, usually over 6 months.  Preventive Services /  Frequency Ages 19 to 39  Blood pressure check.** / Every 1 to 2 years.   Lipid and cholesterol check.** / Every 5 years beginning at age 20.   Clinical breast exam.** / Every 3 years for women in their 20s and 30s.   Pap test.** / Every 2 years from ages 21 through 29. Every 3 years starting at age 30 through age 65 or 70 with a history of 3 consecutive normal Pap tests.   HPV screening.** / Every 3 years from ages 30 through ages 65 to 70 with a history of 3 consecutive normal Pap tests.   Hepatitis C blood test.** / For any individual with known risks for hepatitis C.   Skin self-exam. / Monthly.   Influenza immunization.** / Every year.   Pneumococcal polysaccharide immunization.** / 1 to 2 doses if you smoke cigarettes or if you have certain chronic medical conditions.   Tetanus, diphtheria, pertussis (Tdap, Td) immunization. / A one-time dose of Tdap vaccine. After that, you need a Td booster dose every 10 years.   HPV immunization. / 3 doses over 6 months, if you are 26 and younger.   Measles, mumps, rubella (MMR) immunization. / You need at least 1 dose of MMR if you were born in 1957 or later. You may also need a second dose.   Meningococcal immunization. / 1 dose if you are age 19 to 21 and a first-year college student living in a residence hall, or have one of several medical conditions, you need to get vaccinated against meningococcal disease. You may also need additional booster doses.   Varicella immunization.** / Consult your caregiver.   Hepatitis A immunization.** / Consult your caregiver. 2 doses, 6 to 18 months   apart.   Hepatitis B immunization.** / Consult your caregiver. 3 doses usually over 6 months.  Ages 40 to 64  Blood pressure check.** / Every 1 to 2 years.   Lipid and cholesterol check.** / Every 5 years beginning at age 20.   Clinical breast exam.** / Every year after age 40.   Mammogram.** / Every year beginning at age 40 and continuing for as  long as you are in good health. Consult with your caregiver.   Pap test.** / Every 3 years starting at age 30 through age 65 or 70 with a history of 3 consecutive normal Pap tests.   HPV screening.** / Every 3 years from ages 30 through ages 65 to 70 with a history of 3 consecutive normal Pap tests.   Fecal occult blood test (FOBT) of stool. / Every year beginning at age 50 and continuing until age 75. You may not need to do this test if you get a colonoscopy every 10 years.   Flexible sigmoidoscopy or colonoscopy.** / Every 5 years for a flexible sigmoidoscopy or every 10 years for a colonoscopy beginning at age 50 and continuing until age 75.   Hepatitis C blood test.** / For all people born from 1945 through 1965 and any individual with known risks for hepatitis C.   Skin self-exam. / Monthly.   Influenza immunization.** / Every year.   Pneumococcal polysaccharide immunization.** / 1 to 2 doses if you smoke cigarettes or if you have certain chronic medical conditions.   Tetanus, diphtheria, pertussis (Tdap, Td) immunization.** / A one-time dose of Tdap vaccine. After that, you need a Td booster dose every 10 years.   Measles, mumps, rubella (MMR) immunization. / You need at least 1 dose of MMR if you were born in 1957 or later. You may also need a second dose.   Varicella immunization.** / Consult your caregiver.   Meningococcal immunization.** / Consult your caregiver.   Hepatitis A immunization.** / Consult your caregiver. 2 doses, 6 to 18 months apart.   Hepatitis B immunization.** / Consult your caregiver. 3 doses, usually over 6 months.  Ages 65 and over  Blood pressure check.** / Every 1 to 2 years.   Lipid and cholesterol check.** / Every 5 years beginning at age 20.   Clinical breast exam.** / Every year after age 40.   Mammogram.** / Every year beginning at age 40 and continuing for as long as you are in good health. Consult with your caregiver.   Pap test.** /  Every 3 years starting at age 30 through age 65 or 70 with a 3 consecutive normal Pap tests. Testing can be stopped between 65 and 70 with 3 consecutive normal Pap tests and no abnormal Pap or HPV tests in the past 10 years.   HPV screening.** / Every 3 years from ages 30 through ages 65 or 70 with a history of 3 consecutive normal Pap tests. Testing can be stopped between 65 and 70 with 3 consecutive normal Pap tests and no abnormal Pap or HPV tests in the past 10 years.   Fecal occult blood test (FOBT) of stool. / Every year beginning at age 50 and continuing until age 75. You may not need to do this test if you get a colonoscopy every 10 years.   Flexible sigmoidoscopy or colonoscopy.** / Every 5 years for a flexible sigmoidoscopy or every 10 years for a colonoscopy beginning at age 50 and continuing until age 75.   Hepatitis   C blood test.** / For all people born from 1945 through 1965 and any individual with known risks for hepatitis C.   Osteoporosis screening.** / A one-time screening for women ages 65 and over and women at risk for fractures or osteoporosis.   Skin self-exam. / Monthly.   Influenza immunization.** / Every year.   Pneumococcal polysaccharide immunization.** / 1 dose at age 65 (or older) if you have never been vaccinated.   Tetanus, diphtheria, pertussis (Tdap, Td) immunization. / A one-time dose of Tdap vaccine if you are over 65 and have contact with an infant, are a healthcare worker, or simply want to be protected from whooping cough. After that, you need a Td booster dose every 10 years.   Varicella immunization.** / Consult your caregiver.   Meningococcal immunization.** / Consult your caregiver.   Hepatitis A immunization.** / Consult your caregiver. 2 doses, 6 to 18 months apart.   Hepatitis B immunization.** / Check with your caregiver. 3 doses, usually over 6 months.  ** Family history and personal history of risk and conditions may change your caregiver's  recommendations. Document Released: 12/29/2001 Document Revised: 10/22/2011 Document Reviewed: 03/30/2011 [employer] Patient Information 2012 [employer]. 

## 2012-03-29 NOTE — Progress Notes (Signed)
  Subjective:     Catherine Mckenzie is a 31 y.o. female and is here for a comprehensive physical exam. The patient reports problems - fatigue, weakness, swelling.  She is seeing Elie Goody, NP at [facility] and has started B12 shots, Vit. D drops, IV iron for low ferritin, and an immune supplement.  She is written for some estrogen since she is on POP.  Desires to go back to combined Oc's, no longer breast feeding.Marland Kitchen  History   Social History  . Marital Status: Married    Spouse Name: N/A    Number of Children: N/A  . Years of Education: N/A   Occupational History  . Not on file.   Social History Main Topics  . Smoking status: Never Smoker   . Smokeless tobacco: Not on file  . Alcohol Use: Yes     rarely  . Drug Use: No  . Sexually Active: Yes -- Female partner(s)    Birth Control/ Protection: Pill   Other Topics Concern  . Not on file   Social History Narrative  . No narrative on file   Health Maintenance  Topic Date Due  . Pap Smear  08/14/1997  . Influenza Vaccine  08/16/2012  . Tetanus/tdap  07/17/2016    The following portions of the patient's history were reviewed and updated as appropriate: allergies, current medications, past family history, past medical history, past social history, past surgical history and problem list.  Review of Systems A comprehensive review of systems was negative.   Objective:    BP 124/79  Pulse 82  Ht 5\' 7"  (1.702 m)  Wt 207 lb (93.895 kg)  BMI 32.42 kg/m2  LMP 03/18/2012  Breastfeeding? Unknown General appearance: alert, cooperative and appears stated age Head: Normocephalic, without obvious abnormality, atraumatic Neck: no adenopathy, supple, symmetrical, trachea midline and thyroid not enlarged, symmetric, no tenderness/mass/nodules Lungs: clear to auscultation bilaterally Breasts: normal appearance, no masses or tenderness Heart: regular rate and rhythm, S1, S2 normal, no murmur, click, rub or gallop Abdomen: soft,  non-tender; bowel sounds normal; no masses,  no organomegaly Pelvic: cervix normal in appearance, external genitalia normal, no adnexal masses or tenderness, no cervical motion tenderness, uterus normal size, shape, and consistency and vagina normal without discharge Extremities: extremities normal, atraumatic, no cyanosis or edema Pulses: 2+ and symmetric Skin: Skin color, texture, turgor normal. No rashes or lesions Lymph nodes: Cervical, supraclavicular, and axillary nodes normal. Neurologic: Grossly normal    Assessment:    Healthy female exam. Contraceptive Counsling      Plan:    Pap smear Resume 35 mcg OC's. Blood work per [facility]. See After Visit Summary for Counseling Recommendations

## 2012-03-30 ENCOUNTER — Other Ambulatory Visit: Payer: Self-pay | Admitting: Family Medicine

## 2012-05-26 ENCOUNTER — Encounter (HOSPITAL_BASED_OUTPATIENT_CLINIC_OR_DEPARTMENT_OTHER): Payer: Self-pay

## 2012-05-26 ENCOUNTER — Emergency Department (HOSPITAL_BASED_OUTPATIENT_CLINIC_OR_DEPARTMENT_OTHER)
Admission: EM | Admit: 2012-05-26 | Discharge: 2012-05-26 | Disposition: A | Discharge status: Home / Self Care | Payer: BC Managed Care – PPO | Attending: Emergency Medicine | Admitting: Emergency Medicine

## 2012-05-26 DIAGNOSIS — G43909 Migraine, unspecified, not intractable, without status migrainosus: Secondary | ICD-10-CM | POA: Insufficient documentation

## 2012-05-26 DIAGNOSIS — R51 Headache: Secondary | ICD-10-CM

## 2012-05-26 DIAGNOSIS — R110 Nausea: Secondary | ICD-10-CM | POA: Insufficient documentation

## 2012-05-26 MED ORDER — PROMETHAZINE HCL 25 MG PO TABS: 25.0000 mg | ORAL_TABLET | Freq: Four times a day (QID) | ORAL | Status: DC | PRN

## 2012-05-26 MED ORDER — SODIUM CHLORIDE 0.9 % IV SOLN
Freq: Once | INTRAVENOUS | Status: AC
  Administered 2012-05-26: 22:00:00 via INTRAVENOUS

## 2012-05-26 MED ORDER — DIPHENHYDRAMINE HCL 50 MG/ML IJ SOLN
25.0000 mg | Freq: Once | INTRAMUSCULAR | Status: AC
  Administered 2012-05-26: 50 mg via INTRAVENOUS
  Filled 2012-05-26: qty 1

## 2012-05-26 MED ORDER — KETOROLAC TROMETHAMINE 30 MG/ML IJ SOLN
30.0000 mg | Freq: Once | INTRAMUSCULAR | Status: AC
  Administered 2012-05-26: 30 mg via INTRAVENOUS
  Filled 2012-05-26: qty 1

## 2012-05-26 MED ORDER — METOCLOPRAMIDE HCL 5 MG/ML IJ SOLN
10.0000 mg | Freq: Once | INTRAMUSCULAR | Status: AC
  Administered 2012-05-26: 10 mg via INTRAVENOUS
  Filled 2012-05-26: qty 2

## 2012-05-26 NOTE — ED Provider Notes (Signed)
History     CSN: [id number]  Arrival date & time 05/26/12  1844   First MD Initiated Contact with Patient 05/26/12 2146      Chief Complaint  Patient presents with  . Migraine  . Nausea    (Consider location/radiation/quality/duration/timing/severity/associated sxs/prior treatment) Patient is a 31 y.o. female presenting with migraine. The history is provided by the patient. No language interpreter was used.  Migraine This is a new problem. The current episode started today. The problem occurs constantly. The problem has been gradually worsening. Associated symptoms include headaches and nausea. Pertinent negatives include no fever. Nothing aggravates the symptoms. She has tried acetaminophen for the symptoms. The treatment provided no relief.  Pt reports she began having a headache today.  Pt reports she has had migranes in the past.  Pt reports she has not had a headache recently  Past Medical History  Diagnosis Date  . Fibroadenoma     left breast  . Thyroid disease   . Allergy   . Migraine headache without aura     Past Surgical History  Procedure Date  . Cesarean section   . Wisdom tooth extraction     x4    Family History  Problem Relation Age of Onset  . Diabetes Mother   . Hypertension Mother   . Hyperlipidemia Mother   . Stroke Mother     TIA  . Asthma Sister   . Depression Sister   . Cancer Maternal Grandmother 50    PANCREATIC  . Heart disease Maternal Grandfather   . Hypertension Maternal Grandfather   . Hypertension Paternal Grandmother   . Cancer Paternal Grandmother     BREAST  . Cancer Paternal Grandfather     PROSTATE  . Seizures Paternal Grandfather   . Hypertension Paternal Grandfather     History  Substance Use Topics  . Smoking status: Never Smoker   . Smokeless tobacco: Not on file  . Alcohol Use: Yes     rarely    OB History    Grav Para Term Preterm Abortions TAB SAB Ect Mult Living   1 1 1       1       Review of Systems    Constitutional: Negative for fever.  Gastrointestinal: Positive for nausea.  Neurological: Positive for headaches.  All other systems reviewed and are negative.    Allergies  Levaquin; Moxifloxacin; Benzocaine; and Benzoyl peroxide-erythromycin  Home Medications   Current Outpatient Rx  Name Route Sig Dispense Refill  . ACETAMINOPHEN 500 MG PO TABS Oral Take 1,000 mg by mouth every 6 (six) hours as needed. For migraine    . ALBUTEROL SULFATE HFA 108 (90 BASE) MCG/ACT IN AERS Inhalation Inhale 2 puffs into the lungs every 6 (six) hours as needed. For cough or wheeze    . ARMOUR THYROID 60 MG PO TABS Oral Take 60 mg by mouth daily.     Marland Kitchen CETIRIZINE HCL 10 MG PO TABS Oral Take 10 mg by mouth daily.    . ERGOCALCIFEROL 8000 UNIT/ML PO SOLN Oral Take 8,000 Units by mouth daily.    Marland Kitchen NALTREXONE HCL PO Oral Take 3 mg by mouth at bedtime.     Janetta Hora ESTRADIOL 0.4-35 MG-MCG PO TABS Oral Take 1 tablet by mouth daily. 1 Package 11  . NYSTATIN 100000 UNIT/GM EX CREA Topical Apply 1 application topically 2 (two) times daily as needed. For yeast    . GUMMI BEAR MULTIVITAMIN/MIN PO CHEW Oral Chew 2  each by mouth daily.      BP 141/86  Pulse 71  Resp 16  Ht 5\' 7"  (1.702 m)  Wt 207 lb (93.895 kg)  BMI 32.42 kg/m2  SpO2 100%  Breastfeeding? Unknown  Physical Exam  Nursing note and vitals reviewed. Constitutional: She is oriented to person, place, and time. She appears well-developed and well-nourished.  HENT:  Head: Normocephalic and atraumatic.  Right Ear: External ear normal.  Left Ear: External ear normal.  Nose: Nose normal.  Mouth/Throat: Oropharynx is clear and moist.  Eyes: Conjunctivae are normal. Pupils are equal, round, and reactive to light.  Neck: Normal range of motion. Neck supple.  Cardiovascular: Normal rate, regular rhythm and normal heart sounds.   Pulmonary/Chest: Effort normal.  Abdominal: Soft.  Musculoskeletal: Normal range of motion.  Neurological:  She is alert and oriented to person, place, and time.  Skin: Skin is warm.  Psychiatric: She has a normal mood and affect.    ED Course  Procedures (including critical care time)  Labs Reviewed - No data to display No results found.   No diagnosis found.    MDM  Pt given IV fluids x 1 liter,  Benadryl, Reglan and torodol IV        Lonia Skinner [place], [place] 05/26/12 2221

## 2012-05-26 NOTE — ED Notes (Signed)
Pt reports onset of "migraine" and nausea that started today.

## 2012-05-28 NOTE — ED Provider Notes (Signed)
Medical screening examination/treatment/procedure(s) were performed by non-physician practitioner and as supervising physician I was immediately available for consultation/collaboration.  Deundra Furber W. Ariea Rochin, MD 05/28/12 0748 

## 2012-07-05 ENCOUNTER — Ambulatory Visit (INDEPENDENT_AMBULATORY_CARE_PROVIDER_SITE_OTHER): Payer: BC Managed Care – PPO | Admitting: Nurse Practitioner

## 2012-07-05 ENCOUNTER — Encounter: Payer: Self-pay | Admitting: Nurse Practitioner

## 2012-07-05 VITALS — BP 107/67 | HR 85 | Ht 67.0 in | Wt 203.0 lb

## 2012-07-05 DIAGNOSIS — G44209 Tension-type headache, unspecified, not intractable: Secondary | ICD-10-CM

## 2012-07-05 DIAGNOSIS — G43719 Chronic migraine without aura, intractable, without status migrainosus: Secondary | ICD-10-CM

## 2012-07-05 DIAGNOSIS — E669 Obesity, unspecified: Secondary | ICD-10-CM | POA: Insufficient documentation

## 2012-07-05 MED ORDER — RIZATRIPTAN BENZOATE 10 MG PO TBDP: 10.0000 mg | ORAL_TABLET | ORAL | Status: DC | PRN

## 2012-07-05 MED ORDER — IMIPRAMINE HCL 25 MG PO TABS: 25.0000 mg | ORAL_TABLET | Freq: Two times a day (BID) | ORAL | Status: DC

## 2012-07-05 NOTE — Progress Notes (Signed)
Diagnosis:  Chronic daily headache with occasional migraines  History: New patient today for consult for ongoing headaches. Pt had headaches as teen and at age 31 [facility]. She stopped having daily headaches after a few years and has done very well up until recently. She has gotten back into a daily pattern. She was starting to take daily tylenol and advil 2 tabs of each. She is no longer doing that. She in fact has seen a therapist who has taken her out of work for 9 weeks to get her self under control. 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 Husband is helpful and she has great family support. She does not want to get back into pattern of daily headaches.    Location: Can be over whole head. Temples Right and left  Number of Headache days/month: Severe:1 Moderate:3 Mild:30  Current Outpatient Prescriptions on File Prior to Visit  Medication Sig Dispense Refill  . acetaminophen (TYLENOL) 500 MG tablet Take 1,000 mg by mouth every 6 (six) hours as needed. For migraine      . ARMOUR THYROID 60 MG tablet Take 60 mg by mouth daily.       . cetirizine (ZYRTEC) 10 MG tablet Take 10 mg by mouth daily.      . ergocalciferol (DRISDOL) 8000 UNIT/ML drops Take 8,000 Units by mouth daily.      . norethindrone-ethinyl estradiol (OVCON-35,BALZIVA,BRIELLYN) 0.4-35 MG-MCG tablet Take 1 tablet by mouth daily.  1 Package  11  . nystatin cream (MYCOSTATIN) Apply 1 application topically 2 (two) times daily as needed. For yeast      . Pediatric Multivit-Minerals-C (GUMMI BEAR MULTIVITAMIN/MIN) CHEW Chew 2 each by mouth daily.      Marland Kitchen imipramine (TOFRANIL) 25 MG tablet Take 1 tablet (25 mg total) by mouth 2 times daily at 12 noon and 4 pm. Take tabs at bedtime  60 tablet  3  . promethazine (PHENERGAN) 25 MG tablet Take 1 tablet (25 mg total) by mouth every 6 (six) hours as needed for nausea.  15 tablet  0  . rizatriptan (MAXALT-MLT) 10 MG disintegrating tablet  Take 1 tablet (10 mg total) by mouth as needed for migraine. May repeat in 2 hours if needed  12 tablet  6    Acute prevention: tylenol and advil  Past Medical History  Diagnosis Date  . Fibroadenoma     left breast  . Thyroid disease   . Allergy   . Migraine headache without aura    Past Surgical History  Procedure Date  . Cesarean section   . Wisdom tooth extraction     x4   Family History  Problem Relation Age of Onset  . Diabetes Mother   . Hypertension Mother   . Hyperlipidemia Mother   . Stroke Mother     TIA  . Asthma Sister   . Depression Sister   . Cancer Maternal Grandmother 50    PANCREATIC  . Heart disease Maternal Grandfather   . Hypertension Maternal Grandfather   . Hypertension Paternal Grandmother   . Cancer Paternal Grandmother     BREAST  . Cancer Paternal Grandfather     PROSTATE  . Seizures Paternal Grandfather   . Hypertension Paternal Grandfather    Social History:  reports that she has never smoked. She does not have any smokeless tobacco history on file. She reports that she drinks alcohol. She reports that she does not  use illicit drugs. Allergies:  Allergies  Allergen Reactions  . Levaquin (Levofloxacin) Other (See Comments)    Panic attack  . Moxifloxacin Other (See Comments)    Panic attack  . Benzocaine Rash    inflammation  . Benzoyl Peroxide-Erythromycin Rash    inflammation    Triggers: Stress  Birth control: BCPs  ROS: positive for daily headache, allergies, thyroid issues, anxiety  Exam: well developed, well nourished Caucasian female  General: NAD HEENT: Negative Cardiac:RRR Lungs:Clear Neuro:Negative Skin:Warm and dry  Impression:mixed tension and migraine headache  Plan: Pt is willing to restart Imipramine since she did so well with it in the past. She will start with 25mg  1/2 tab then up to 1 tab then up to 2 tabs as tolerated. She will use Maxalt when she gets a migraine. She will continue with counseling  and we will refer her to [facility]. RTC 3 weeks   Time Spent:45 min

## 2012-07-05 NOTE — Patient Instructions (Signed)

## 2012-08-01 ENCOUNTER — Encounter: Payer: Self-pay | Admitting: Nurse Practitioner

## 2012-08-02 ENCOUNTER — Encounter: Payer: Self-pay | Admitting: Nurse Practitioner

## 2012-08-02 ENCOUNTER — Ambulatory Visit (INDEPENDENT_AMBULATORY_CARE_PROVIDER_SITE_OTHER): Payer: BC Managed Care – PPO | Admitting: Nurse Practitioner

## 2012-08-02 VITALS — BP 115/73 | HR 87 | Ht 67.0 in | Wt 201.0 lb

## 2012-08-02 DIAGNOSIS — R51 Headache: Secondary | ICD-10-CM

## 2012-08-02 DIAGNOSIS — R519 Headache, unspecified: Secondary | ICD-10-CM

## 2012-08-02 NOTE — Patient Instructions (Signed)
Headache and Allergies  The relationship between allergies and headaches is unclear. Many people with allergic or infectious nasal problems also have headaches (migraines or sinus headaches). However, sometimes allergies can cause pressure that feels like a headache, and sometimes headaches can cause allergy-like symptoms. It is not always clear whether your symptoms are caused by allergies or by a headache.  CAUSES    Migraine: The cause of a migraine is not always known.   Sinus Headache: The cause of a sinus headache may be a sinus infection. Other conditions that may be related to sinus headaches include:   Hay fever (allergic rhinitis).   Deviation of the nasal septum.   Swelling or clogging of the nasal passages.  SYMPTOMS   Migraine headache symptoms (which often last 4 to 72 hours) include:   Intense, throbbing pain on one or both sides of the head.   Nausea.   Vomiting.   Being extra sensitive to light.   Being extra sensitive to sound.   Nervous system reactions that appear similar to an allergic reaction:   Stuffy nose.   Runny nose.   Tearing.  Sinus headaches are felt as facial pain or pressure.   DIAGNOSIS   Because there is some overlap in symptoms, sinus and migraine headaches are often misdiagnosed. For example, a person with migraines may also feel facial pressure. Likewise, many people with hay fever may get migraine headaches rather than sinus headaches. These migraines can be triggered by the histamine release during an allergic reaction. An antihistamine medicine can eliminate this pain.  There are standard criteria that help clarify the difference between these headaches and related allergy or allergy-like symptoms. Your caregiver can use these criteria to determine the proper diagnosis and provide you the best care.  TREATMENT   Migraine medicine may help people who have persistent migraine headaches even though their hay fever is controlled. For some people, anti-inflammatory  treatments do not work to relieve migraines. Medicines called triptans (such as sumatriptan) can be helpful for those people.  Document Released: 01/23/2004 Document Revised: 10/22/2011 Document Reviewed: 02/14/2010  [employer] Patient Information 2012 [employer]

## 2012-08-02 NOTE — Progress Notes (Signed)
S: Pt is here for headache follow up. She took the Imipramine 25 mg for approximately 10 days then discontinued. She was at the same time trying a gluten free diet and did not want to confuse the two as far as the effect on headaches. She did have some food allergy testing by a NP. She has been doing better with her headaches in general. She has had 5 out of 7 days free of heaache. She has taken tylenol x 1 only. She has not tried Maxalt yet. She has been going [facility] and has received a great deal of help from them. She will be going back to work on October 15th. She does now realize that the majority of her stress is work related.   O: Alert, oriented, NAD Skin warm and dry Neuro: negative  A: Daily headaches- Improving  P: We had a lengthy discussion concerning headaches, medications, stressors and diet issues. She is choosing at this time to stay on gluten free diet and may restart Imipramine if needed. She will RTC prn

## 2013-03-29 ENCOUNTER — Ambulatory Visit: Payer: BC Managed Care – PPO | Admitting: Family Medicine

## 2013-04-03 ENCOUNTER — Encounter: Payer: Self-pay | Admitting: Family Medicine

## 2013-04-03 ENCOUNTER — Ambulatory Visit (INDEPENDENT_AMBULATORY_CARE_PROVIDER_SITE_OTHER): Payer: BC Managed Care – PPO | Admitting: Family Medicine

## 2013-04-03 VITALS — BP 141/83 | HR 81 | Resp 16 | Ht 67.0 in | Wt 189.0 lb

## 2013-04-03 DIAGNOSIS — Z1151 Encounter for screening for human papillomavirus (HPV): Secondary | ICD-10-CM

## 2013-04-03 DIAGNOSIS — Z01419 Encounter for gynecological examination (general) (routine) without abnormal findings: Secondary | ICD-10-CM

## 2013-04-03 DIAGNOSIS — Z124 Encounter for screening for malignant neoplasm of cervix: Secondary | ICD-10-CM

## 2013-04-03 DIAGNOSIS — K900 Celiac disease: Secondary | ICD-10-CM

## 2013-04-03 DIAGNOSIS — Z3041 Encounter for surveillance of contraceptive pills: Secondary | ICD-10-CM

## 2013-04-03 MED ORDER — NORETHINDRONE 0.35 MG PO TABS
1.0000 | ORAL_TABLET | Freq: Every day | ORAL | Status: DC
Start: 2013-04-03 — End: 2014-04-07

## 2013-04-03 MED ORDER — NORETHINDRONE 0.35 MG PO TABS: 1.0000 | ORAL_TABLET | Freq: Every day | ORAL | Status: DC

## 2013-04-03 NOTE — Patient Instructions (Addendum)
Preventive Care for Adults, Female A healthy lifestyle and preventive care can promote health and wellness. Preventive health guidelines for women include the following key practices.  A routine yearly physical is a good way to check with your caregiver about your health and preventive screening. It is a chance to share any concerns and updates on your health, and to receive a thorough exam.  Visit your dentist for a routine exam and preventive care every 6 months. Brush your teeth twice a day and floss once a day. Good oral hygiene prevents tooth decay and gum disease.  The frequency of eye exams is based on your age, health, family medical history, use of contact lenses, and other factors. Follow your caregiver's recommendations for frequency of eye exams.  Eat a healthy diet. Foods like vegetables, fruits, whole grains, low-fat dairy products, and lean protein foods contain the nutrients you need without too many calories. Decrease your intake of foods high in solid fats, added sugars, and salt. Eat the right amount of calories for you.Get information about a proper diet from your caregiver, if necessary.  Regular physical exercise is one of the most important things you can do for your health. Most adults should get at least 150 minutes of moderate-intensity exercise (any activity that increases your heart rate and causes you to sweat) each week. In addition, most adults need muscle-strengthening exercises on 2 or more days a week.  Maintain a healthy weight. The body mass index (BMI) is a screening tool to identify possible weight problems. It provides an estimate of body fat based on height and weight. Your caregiver can help determine your BMI, and can help you achieve or maintain a healthy weight.For adults 20 years and older:  A BMI below 18.5 is considered underweight.  A BMI of 18.5 to 24.9 is normal.  A BMI of 25 to 29.9 is considered overweight.  A BMI of 30 and above is  considered obese.  Maintain normal blood lipids and cholesterol levels by exercising and minimizing your intake of saturated fat. Eat a balanced diet with plenty of fruit and vegetables. Blood tests for lipids and cholesterol should begin at age 20 and be repeated every 5 years. If your lipid or cholesterol levels are high, you are over 50, or you are at high risk for heart disease, you may need your cholesterol levels checked more frequently.Ongoing high lipid and cholesterol levels should be treated with medicines if diet and exercise are not effective.  If you smoke, find out from your caregiver how to quit. If you do not use tobacco, do not start.  If you are pregnant, do not drink alcohol. If you are breastfeeding, be very cautious about drinking alcohol. If you are not pregnant and choose to drink alcohol, do not exceed 1 drink per day. One drink is considered to be 12 ounces (355 mL) of beer, 5 ounces (148 mL) of wine, or 1.5 ounces (44 mL) of liquor.  Avoid use of street drugs. Do not share needles with anyone. Ask for help if you need support or instructions about stopping the use of drugs.  High blood pressure causes heart disease and increases the risk of stroke. Your blood pressure should be checked at least every 1 to 2 years. Ongoing high blood pressure should be treated with medicines if weight loss and exercise are not effective.  If you are 55 to 34 years old, ask your caregiver if you should take aspirin to prevent strokes.  Diabetes   screening involves taking a blood sample to check your fasting blood sugar level. This should be done once every 3 years, after age 45, if you are within normal weight and without risk factors for diabetes. Testing should be considered at a younger age or be carried out more frequently if you are overweight and have at least 1 risk factor for diabetes.  Breast cancer screening is essential preventive care for women. You should practice "breast  self-awareness." This means understanding the normal appearance and feel of your breasts and may include breast self-examination. Any changes detected, no matter how small, should be reported to a caregiver. Women in their 20s and 30s should have a clinical breast exam (CBE) by a caregiver as part of a regular health exam every 1 to 3 years. After age 40, women should have a CBE every year. Starting at age 40, women should consider having a mammography (breast X-ray test) every year. Women who have a family history of breast cancer should talk to their caregiver about genetic screening. Women at a high risk of breast cancer should talk to their caregivers about having magnetic resonance imaging (MRI) and a mammography every year.  The Pap test is a screening test for cervical cancer. A Pap test can show cell changes on the cervix that might become cervical cancer if left untreated. A Pap test is a procedure in which cells are obtained and examined from the lower end of the uterus (cervix).  Women should have a Pap test starting at age 21.  Between ages 21 and 29, Pap tests should be repeated every 2 years.  Beginning at age 30, you should have a Pap test every 3 years as long as the past 3 Pap tests have been normal.  Some women have medical problems that increase the chance of getting cervical cancer. Talk to your caregiver about these problems. It is especially important to talk to your caregiver if a new problem develops soon after your last Pap test. In these cases, your caregiver may recommend more frequent screening and Pap tests.  The above recommendations are the same for women who have or have not gotten the vaccine for human papillomavirus (HPV).  If you had a hysterectomy for a problem that was not cancer or a condition that could lead to cancer, then you no longer need Pap tests. Even if you no longer need a Pap test, a regular exam is a good idea to make sure no other problems are  starting.  If you are between ages 65 and 70, and you have had normal Pap tests going back 10 years, you no longer need Pap tests. Even if you no longer need a Pap test, a regular exam is a good idea to make sure no other problems are starting.  If you have had past treatment for cervical cancer or a condition that could lead to cancer, you need Pap tests and screening for cancer for at least 20 years after your treatment.  If Pap tests have been discontinued, risk factors (such as a new sexual partner) need to be reassessed to determine if screening should be resumed.  The HPV test is an additional test that may be used for cervical cancer screening. The HPV test looks for the virus that can cause the cell changes on the cervix. The cells collected during the Pap test can be tested for HPV. The HPV test could be used to screen women aged 30 years and older, and should   be used in women of any age who have unclear Pap test results. After the age of 30, women should have HPV testing at the same frequency as a Pap test.  Colorectal cancer can be detected and often prevented. Most routine colorectal cancer screening begins at the age of 50 and continues through age 75. However, your caregiver may recommend screening at an earlier age if you have risk factors for colon cancer. On a yearly basis, your caregiver may provide home test kits to check for hidden blood in the stool. Use of a small camera at the end of a tube, to directly examine the colon (sigmoidoscopy or colonoscopy), can detect the earliest forms of colorectal cancer. Talk to your caregiver about this at age 50, when routine screening begins. Direct examination of the colon should be repeated every 5 to 10 years through age 75, unless early forms of pre-cancerous polyps or small growths are found.  Hepatitis C blood testing is recommended for all people born from 1945 through 1965 and any individual with known risks for hepatitis C.  Practice  safe sex. Use condoms and avoid high-risk sexual practices to reduce the spread of sexually transmitted infections (STIs). STIs include gonorrhea, chlamydia, syphilis, trichomonas, herpes, HPV, and human immunodeficiency virus (HIV). Herpes, HIV, and HPV are viral illnesses that have no cure. They can result in disability, cancer, and death. Sexually active women aged 25 and younger should be checked for chlamydia. Older women with new or multiple partners should also be tested for chlamydia. Testing for other STIs is recommended if you are sexually active and at increased risk.  Osteoporosis is a disease in which the bones lose minerals and strength with aging. This can result in serious bone fractures. The risk of osteoporosis can be identified using a bone density scan. Women ages 65 and over and women at risk for fractures or osteoporosis should discuss screening with their caregivers. Ask your caregiver whether you should take a calcium supplement or vitamin D to reduce the rate of osteoporosis.  Menopause can be associated with physical symptoms and risks. Hormone replacement therapy is available to decrease symptoms and risks. You should talk to your caregiver about whether hormone replacement therapy is right for you.  Use sunscreen with sun protection factor (SPF) of 30 or more. Apply sunscreen liberally and repeatedly throughout the day. You should seek shade when your shadow is shorter than you. Protect yourself by wearing long sleeves, pants, a wide-brimmed hat, and sunglasses year round, whenever you are outdoors.  Once a month, do a whole body skin exam, using a mirror to look at the skin on your back. Notify your caregiver of new moles, moles that have irregular borders, moles that are larger than a pencil eraser, or moles that have changed in shape or color.  Stay current with required immunizations.  Influenza. You need a dose every fall (or winter). The composition of the flu vaccine  changes each year, so being vaccinated once is not enough.  Pneumococcal polysaccharide. You need 1 to 2 doses if you smoke cigarettes or if you have certain chronic medical conditions. You need 1 dose at age 65 (or older) if you have never been vaccinated.  Tetanus, diphtheria, pertussis (Tdap, Td). Get 1 dose of Tdap vaccine if you are younger than age 65, are over 65 and have contact with an infant, are a healthcare worker, are pregnant, or simply want to be protected from whooping cough. After that, you need a Td   booster dose every 10 years. Consult your caregiver if you have not had at least 3 tetanus and diphtheria-containing shots sometime in your life or have a deep or dirty wound.  HPV. You need this vaccine if you are a woman age 26 or younger. The vaccine is given in 3 doses over 6 months.  Measles, mumps, rubella (MMR). You need at least 1 dose of MMR if you were born in 1957 or later. You may also need a second dose.  Meningococcal. If you are age 19 to 21 and a first-year college student living in a residence hall, or have one of several medical conditions, you need to get vaccinated against meningococcal disease. You may also need additional booster doses.  Zoster (shingles). If you are age 60 or older, you should get this vaccine.  Varicella (chickenpox). If you have never had chickenpox or you were vaccinated but received only 1 dose, talk to your caregiver to find out if you need this vaccine.  Hepatitis A. You need this vaccine if you have a specific risk factor for hepatitis A virus infection or you simply wish to be protected from this disease. The vaccine is usually given as 2 doses, 6 to 18 months apart.  Hepatitis B. You need this vaccine if you have a specific risk factor for hepatitis B virus infection or you simply wish to be protected from this disease. The vaccine is given in 3 doses, usually over 6 months. Preventive Services / Frequency Ages 19 to 39  Blood  pressure check.** / Every 1 to 2 years.  Lipid and cholesterol check.** / Every 5 years beginning at age 20.  Clinical breast exam.** / Every 3 years for women in their 20s and 30s.  Pap test.** / Every 2 years from ages 21 through 29. Every 3 years starting at age 30 through age 65 or 70 with a history of 3 consecutive normal Pap tests.  HPV screening.** / Every 3 years from ages 30 through ages 65 to 70 with a history of 3 consecutive normal Pap tests.  Hepatitis C blood test.** / For any individual with known risks for hepatitis C.  Skin self-exam. / Monthly.  Influenza immunization.** / Every year.  Pneumococcal polysaccharide immunization.** / 1 to 2 doses if you smoke cigarettes or if you have certain chronic medical conditions.  Tetanus, diphtheria, pertussis (Tdap, Td) immunization. / A one-time dose of Tdap vaccine. After that, you need a Td booster dose every 10 years.  HPV immunization. / 3 doses over 6 months, if you are 26 and younger.  Measles, mumps, rubella (MMR) immunization. / You need at least 1 dose of MMR if you were born in 1957 or later. You may also need a second dose.  Meningococcal immunization. / 1 dose if you are age 19 to 21 and a first-year college student living in a residence hall, or have one of several medical conditions, you need to get vaccinated against meningococcal disease. You may also need additional booster doses.  Varicella immunization.** / Consult your caregiver.  Hepatitis A immunization.** / Consult your caregiver. 2 doses, 6 to 18 months apart.  Hepatitis B immunization.** / Consult your caregiver. 3 doses usually over 6 months. Ages 40 to 64  Blood pressure check.** / Every 1 to 2 years.  Lipid and cholesterol check.** / Every 5 years beginning at age 20.  Clinical breast exam.** / Every year after age 40.  Mammogram.** / Every year beginning at age 40   and continuing for as long as you are in good health. Consult with your  caregiver.  Pap test.** / Every 3 years starting at age 30 through age 65 or 70 with a history of 3 consecutive normal Pap tests.  HPV screening.** / Every 3 years from ages 30 through ages 65 to 70 with a history of 3 consecutive normal Pap tests.  Fecal occult blood test (FOBT) of stool. / Every year beginning at age 50 and continuing until age 75. You may not need to do this test if you get a colonoscopy every 10 years.  Flexible sigmoidoscopy or colonoscopy.** / Every 5 years for a flexible sigmoidoscopy or every 10 years for a colonoscopy beginning at age 50 and continuing until age 75.  Hepatitis C blood test.** / For all people born from 1945 through 1965 and any individual with known risks for hepatitis C.  Skin self-exam. / Monthly.  Influenza immunization.** / Every year.  Pneumococcal polysaccharide immunization.** / 1 to 2 doses if you smoke cigarettes or if you have certain chronic medical conditions.  Tetanus, diphtheria, pertussis (Tdap, Td) immunization.** / A one-time dose of Tdap vaccine. After that, you need a Td booster dose every 10 years.  Measles, mumps, rubella (MMR) immunization. / You need at least 1 dose of MMR if you were born in 1957 or later. You may also need a second dose.  Varicella immunization.** / Consult your caregiver.  Meningococcal immunization.** / Consult your caregiver.  Hepatitis A immunization.** / Consult your caregiver. 2 doses, 6 to 18 months apart.  Hepatitis B immunization.** / Consult your caregiver. 3 doses, usually over 6 months. Ages 65 and over  Blood pressure check.** / Every 1 to 2 years.  Lipid and cholesterol check.** / Every 5 years beginning at age 20.  Clinical breast exam.** / Every year after age 40.  Mammogram.** / Every year beginning at age 40 and continuing for as long as you are in good health. Consult with your caregiver.  Pap test.** / Every 3 years starting at age 30 through age 65 or 70 with a 3  consecutive normal Pap tests. Testing can be stopped between 65 and 70 with 3 consecutive normal Pap tests and no abnormal Pap or HPV tests in the past 10 years.  HPV screening.** / Every 3 years from ages 30 through ages 65 or 70 with a history of 3 consecutive normal Pap tests. Testing can be stopped between 65 and 70 with 3 consecutive normal Pap tests and no abnormal Pap or HPV tests in the past 10 years.  Fecal occult blood test (FOBT) of stool. / Every year beginning at age 50 and continuing until age 75. You may not need to do this test if you get a colonoscopy every 10 years.  Flexible sigmoidoscopy or colonoscopy.** / Every 5 years for a flexible sigmoidoscopy or every 10 years for a colonoscopy beginning at age 50 and continuing until age 75.  Hepatitis C blood test.** / For all people born from 1945 through 1965 and any individual with known risks for hepatitis C.  Osteoporosis screening.** / A one-time screening for women ages 65 and over and women at risk for fractures or osteoporosis.  Skin self-exam. / Monthly.  Influenza immunization.** / Every year.  Pneumococcal polysaccharide immunization.** / 1 dose at age 65 (or older) if you have never been vaccinated.  Tetanus, diphtheria, pertussis (Tdap, Td) immunization. / A one-time dose of Tdap vaccine if you are over   65 and have contact with an infant, are a Research scientist (physical sciences), or simply want to be protected from whooping cough. After that, you need a Td booster dose every 10 years.  Varicella immunization.** / Consult your caregiver.  Meningococcal immunization.** / Consult your caregiver.  Hepatitis A immunization.** / Consult your caregiver. 2 doses, 6 to 18 months apart.  Hepatitis B immunization.** / Check with your caregiver. 3 doses, usually over 6 months. ** Family history and personal history of risk and conditions may change your caregiver's recommendations. Document Released: 12/29/2001 Document Revised: 01/25/2012  Document Reviewed: 03/30/2011 [facility] Patient Information 2013 [place], [place]. Preparing for Pregnancy Preparing for pregnancy (preconceptual care) by getting counseling and information from your caregiver before getting pregnant is a good idea. It will help you and your baby have a better chance to have a healthy, safe pregnancy and delivery of your baby. Make an appointment with your caregiver to talk about your health, medical, and family history and how to prepare yourself before getting pregnant. Your caregiver will do a complete physical exam and a Pap test. They will want to know:  About you, your spouse or partner, and your family's medical and genetic history.  If you are eating a balanced diet and drinking enough fluids.  What vitamins and mineral supplements you are taking. This includes taking folic acid before getting pregnant to help prevent birth defects.  What medications you are taking including prescription, over-the-counter and herbal medications.  If there is any substance abuse like alcohol, smoking, and illegal drugs.  If there is any mental or physical domestic violence.  If there is any risk of sexually transmitted disease between you and your partner.  What immunizations and vaccinations you have had and what you may need before getting pregnant.  If you should get tested for HIV infection.  If there is any exposure to chemical or toxic substances at home or work.  If there are medical problems you have that need to be treated and kept under control before getting pregnant such as diabetes, high blood pressure or others.  If there were any past surgeries, pregnancies and problems with them.  What your current weight is and to set a goal as to how much weight you should gain while pregnant. Also, they will check if you should lose or gain weight before getting pregnant.  What is your exercise routine and what it is safe when you are pregnant.  If there are  any physical disabilities that need to be addressed.  About spacing your pregnancies when there are other children.  If there is a financial problem that may affect you having a child. After talking about the above points with your caregiver, your caregiver will give you advice on how to help treat and work with you on solving any issues, if necessary, before getting pregnant. The goal is to have a healthy and safe pregnancy for you and your baby. You should keep an accurate record of your menstrual periods because it will help in determining your due date. Immunizations that you should have before getting pregnant:   Regular measles, [place] measles (rubella) and mumps.  Tetanus and diphtheria.  Chickenpox, if not immune.  Herpes zoster (Varicella) if not immune.  Human papilloma virus vaccine (HPV) between the age of 26 and 24 years old.  Hepatitis A vaccine.  Hepatitis B vaccine.  Influenza vaccine.  Pneumococcal vaccine (pneumonia). You should avoid getting pregnant for one month after getting vaccinated with a live  virus vaccine such as [place] measles (rubella) vaccine. Other immunizations may be necessary depending on where you live, such as malaria. Ask your caregiver if any other immunizations are needed for you. HOME CARE INSTRUCTIONS   Follow the advice of your caregiver.  Before getting pregnant:  Begin taking vitamins, supplements, and 0.4 milligrams folic acid daily.  Get your immunizations up-to-date.  Get help from a nutrition counselor if you do not understand what a balanced diet is, need help with a special medical diet or if you need help to lose or gain weight.  Begin exercising.  Stop smoking, taking illegal drugs, and drinking alcoholic beverages.  Get counseling if there is and type of domestic violence.  Get checked for sexually transmitted diseases including HIV.  Get any medical problems under control (diabetes, high blood pressure, convulsions,  asthma or others).  Resolve any financial concerns.  Be sure you and your spouse or partner are ready to have a baby.  Keep an accurate record of your menstrual periods. Document Released: 10/15/2008 Document Revised: 01/25/2012 Document Reviewed: 10/15/2008 [facility] Patient Information 2013 [place], [place].

## 2013-04-03 NOTE — Progress Notes (Signed)
  Subjective:     Catherine Mckenzie is a 32 y.o. female and is here for a comprehensive physical exam. The patient reports headaches during medication free week of pill pack.  Interested in having another baby soon.Marland Kitchen  History   Social History  . Marital Status: Married    Spouse Name: N/A    Number of Children: N/A  . Years of Education: N/A   Occupational History  . Not on file.   Social History Main Topics  . Smoking status: Never Smoker   . Smokeless tobacco: Not on file  . Alcohol Use: Yes     Comment: rarely  . Drug Use: No  . Sexually Active: Yes -- Female partner(s)    Birth Control/ Protection: Pill   Other Topics Concern  . Not on file   Social History Narrative  . No narrative on file   Health Maintenance  Topic Date Due  . Influenza Vaccine  07/17/2013  . Pap Smear  03/30/2015  . Tetanus/tdap  07/17/2016    The following portions of the patient's history were reviewed and updated as appropriate: allergies, current medications, past family history, past medical history, past social history, past surgical history and problem list.  Review of Systems A comprehensive review of systems was negative.   Objective:    BP 141/83  Pulse 81  Resp 16  Ht 5\' 7"  (1.702 m)  Wt 189 lb (85.73 kg)  BMI 29.59 kg/m2  LMP 03/27/2013 General appearance: alert, cooperative and appears stated age Head: Normocephalic, without obvious abnormality, atraumatic Neck: no adenopathy, supple, symmetrical, trachea midline and thyroid not enlarged, symmetric, no tenderness/mass/nodules Lungs: clear to auscultation bilaterally Breasts: normal appearance, no masses or tenderness Heart: regular rate and rhythm, S1, S2 normal, no murmur, click, rub or gallop Abdomen: soft, non-tender; bowel sounds normal; no masses,  no organomegaly Pelvic: cervix normal in appearance, external genitalia normal, no adnexal masses or tenderness, no cervical motion tenderness, uterus normal size, shape, and  consistency and vagina normal without discharge Extremities: extremities normal, atraumatic, no cyanosis or edema Pulses: 2+ and symmetric Skin: Skin color, texture, turgor normal. No rashes or lesions Lymph nodes: Cervical, supraclavicular, and axillary nodes normal. Neurologic: Alert and oriented X 3, normal strength and tone. Normal symmetric reflexes. Normal coordination and gait    Assessment:    Healthy female exam. Migraine headache.     Plan:    Pap smear. Skip placebos of pill pack. Maxalt prn. See After Visit Summary for Counseling Recommendations

## 2013-05-22 ENCOUNTER — Other Ambulatory Visit: Payer: Self-pay | Admitting: Family Medicine

## 2013-11-24 ENCOUNTER — Telehealth: Payer: Self-pay | Admitting: *Deleted

## 2013-11-24 NOTE — Telephone Encounter (Signed)
Patient is requesting refill of her rizatritptan 10mg .

## 2014-04-07 ENCOUNTER — Encounter (HOSPITAL_COMMUNITY): Payer: Self-pay | Admitting: *Deleted

## 2014-04-07 ENCOUNTER — Inpatient Hospital Stay (HOSPITAL_COMMUNITY): Payer: BC Managed Care – PPO

## 2014-04-07 ENCOUNTER — Inpatient Hospital Stay (HOSPITAL_COMMUNITY)
Admission: AD | Admit: 2014-04-07 | Discharge: 2014-04-08 | Disposition: A | Discharge status: Home / Self Care | Payer: BC Managed Care – PPO | Source: Ambulatory Visit | Attending: Obstetrics & Gynecology | Admitting: Obstetrics & Gynecology

## 2014-04-07 DIAGNOSIS — R109 Unspecified abdominal pain: Secondary | ICD-10-CM | POA: Insufficient documentation

## 2014-04-07 DIAGNOSIS — O200 Threatened abortion: Secondary | ICD-10-CM | POA: Insufficient documentation

## 2014-04-07 LAB — URINE MICROSCOPIC-ADD ON

## 2014-04-07 LAB — URINALYSIS, ROUTINE W REFLEX MICROSCOPIC
Bilirubin Urine: NEGATIVE
Glucose, UA: NEGATIVE mg/dL
Ketones, ur: NEGATIVE mg/dL
LEUKOCYTES UA: NEGATIVE
NITRITE: NEGATIVE
PH: 5.5 (ref 5.0–8.0)
Protein, ur: NEGATIVE mg/dL
Urobilinogen, UA: 0.2 mg/dL (ref 0.0–1.0)

## 2014-04-07 LAB — WET PREP, GENITAL
Clue Cells Wet Prep HPF POC: NONE SEEN
Trich, Wet Prep: NONE SEEN
Yeast Wet Prep HPF POC: NONE SEEN

## 2014-04-07 LAB — CBC
HCT: 40 % (ref 36.0–46.0)
HEMOGLOBIN: 13.6 g/dL (ref 12.0–15.0)
MCH: 30.5 pg (ref 26.0–34.0)
MCHC: 34 g/dL (ref 30.0–36.0)
MCV: 89.7 fL (ref 78.0–100.0)
Platelets: 251 10*3/uL (ref 150–400)
RBC: 4.46 MIL/uL (ref 3.87–5.11)
RDW: 12.7 % (ref 11.5–15.5)
WBC: 6.7 10*3/uL (ref 4.0–10.5)

## 2014-04-07 LAB — HCG, QUANTITATIVE, PREGNANCY: HCG, BETA CHAIN, QUANT, S: 27 m[IU]/mL — AB (ref <5)

## 2014-04-07 LAB — POCT PREGNANCY, URINE: Preg Test, Ur: NEGATIVE

## 2014-04-07 MED ORDER — RHO D IMMUNE GLOBULIN 1500 UNIT/2ML IJ SOSY
300.0000 ug | PREFILLED_SYRINGE | Freq: Once | INTRAMUSCULAR | Status: AC
  Administered 2014-04-07: 300 ug via INTRAMUSCULAR
  Filled 2014-04-07: qty 2

## 2014-04-07 NOTE — MAU Provider Note (Signed)
History     CSN: [id number]  Arrival date and time: 04/07/14 2001   First Provider Initiated Contact with Patient 04/07/14 2039      Chief Complaint  Patient presents with  . Vaginal Bleeding   HPI Ms. Catherine Mckenzie is a 33 y.o. G2P1001 at [redacted]w[redacted]d who presents to MAU today with complaint of vaginal bleeding. The patient states that she noted spotting on Wednesday that became heavier today. She has also noted a few small brown clots. She is not soaking her pads. The blood has changed from mostly brown to red today as well. She states occasional diffuse abdominal pain, but none know. She thinks that it was gas. She has had nausea without vomiting, diarrhea or constipation. She denies UTI symptoms or fever. She has an appointment to start prenatal care with [facility] at [facility] on Tuesday.   OB History   Grav Para Term Preterm Abortions TAB SAB Ect Mult Living   2 1 1       1       Past Medical History  Diagnosis Date  . Fibroadenoma     left breast  . Thyroid disease   . Allergy   . Migraine headache without aura     Past Surgical History  Procedure Laterality Date  . Cesarean section    . Wisdom tooth extraction      x4    Family History  Problem Relation Age of Onset  . Diabetes Mother   . Hypertension Mother   . Hyperlipidemia Mother   . Stroke Mother     TIA  . Asthma Sister   . Depression Sister   . Cancer Maternal Grandmother 50    PANCREATIC  . Heart disease Maternal Grandfather   . Hypertension Maternal Grandfather   . Hypertension Paternal Grandmother   . Cancer Paternal Grandmother     BREAST  . Stroke Paternal Grandmother   . Cancer Paternal Grandfather     PROSTATE  . Seizures Paternal Grandfather   . Hypertension Paternal Grandfather   . Stroke Paternal Grandfather     History  Substance Use Topics  . Smoking status: Never Smoker   . Smokeless tobacco: Never Used  . Alcohol Use: Yes     Comment: rarely    Allergies:  Allergies  Allergen  Reactions  . Levaquin [Levofloxacin] Other (See Comments)    panic attacks  . Moxifloxacin Other (See Comments)    panic attacks  . Benzocaine Rash  . Benzoyl Peroxide-Erythromycin Rash    No prescriptions prior to admission    Review of Systems  Constitutional: Negative for fever and malaise/fatigue.  Gastrointestinal: Positive for nausea and abdominal pain. Negative for vomiting, diarrhea and constipation.  Genitourinary: Negative for dysuria, urgency and frequency.       + vaginal bleeding Neg - vaginal discharge   Physical Exam   Blood pressure 138/71, pulse 78, temperature 98.3 F (36.8 C), temperature source Oral, resp. rate 20, height 5\' 7"  (1.702 m), weight 181 lb 12.8 oz (82.464 kg), last menstrual period 02/26/2014.  Physical Exam  Constitutional: She is oriented to person, place, and time. She appears well-developed and well-nourished. No distress.  HENT:  Head: Normocephalic and atraumatic.  Cardiovascular: Normal rate.   Respiratory: Effort normal.  GI: Soft. She exhibits no distension and no mass. There is no tenderness. There is no rebound and no guarding.  Genitourinary: Uterus is not enlarged and not tender. Cervix exhibits no motion tenderness, no discharge and no friability.  Right adnexum displays no mass and no tenderness. Left adnexum displays no mass and no tenderness. There is bleeding (small amount of blood in the vaginal vault) around the vagina. No vaginal discharge found.  Neurological: She is alert and oriented to person, place, and time.  Skin: Skin is warm and dry. No erythema.  Psychiatric: She has a normal mood and affect.  Cervix: closed, thick  Results for orders placed during the hospital encounter of 04/07/14 (from the past 24 hour(s))  WET PREP, GENITAL     Status: Abnormal   Collection Time    04/07/14  8:35 PM      Result Value Ref Range   Yeast Wet Prep HPF POC NONE SEEN  NONE SEEN   Trich, Wet Prep NONE SEEN  NONE SEEN   Clue Cells  Wet Prep HPF POC NONE SEEN  NONE SEEN   WBC, Wet Prep HPF POC MODERATE (*) NONE SEEN  URINALYSIS, ROUTINE W REFLEX MICROSCOPIC     Status: Abnormal   Collection Time    04/07/14  8:35 PM      Result Value Ref Range   Color, Urine YELLOW  YELLOW   APPearance CLEAR  CLEAR   Specific Gravity, Urine <1.005 (*) 1.005 - 1.030   pH 5.5  5.0 - 8.0   Glucose, UA NEGATIVE  NEGATIVE mg/dL   Hgb urine dipstick LARGE (*) NEGATIVE   Bilirubin Urine NEGATIVE  NEGATIVE   Ketones, ur NEGATIVE  NEGATIVE mg/dL   Protein, ur NEGATIVE  NEGATIVE mg/dL   Urobilinogen, UA 0.2  0.0 - 1.0 mg/dL   Nitrite NEGATIVE  NEGATIVE   Leukocytes, UA NEGATIVE  NEGATIVE  URINE MICROSCOPIC-ADD ON     Status: None   Collection Time    04/07/14  8:35 PM      Result Value Ref Range   Squamous Epithelial / LPF RARE  RARE   WBC, UA 0-2  <3 WBC/hpf   RBC / HPF 3-6  <3 RBC/hpf   Bacteria, UA RARE  RARE  POCT PREGNANCY, URINE     Status: None   Collection Time    04/07/14  8:45 PM      Result Value Ref Range   Preg Test, Ur NEGATIVE  NEGATIVE  CBC     Status: None   Collection Time    04/07/14  8:50 PM      Result Value Ref Range   WBC 6.7  4.0 - 10.5 K/uL   RBC 4.46  3.87 - 5.11 MIL/uL   Hemoglobin 13.6  12.0 - 15.0 g/dL   HCT 40.0  36.0 - 46.0 %   MCV 89.7  78.0 - 100.0 fL   MCH 30.5  26.0 - 34.0 pg   MCHC 34.0  30.0 - 36.0 g/dL   RDW 12.7  11.5 - 15.5 %   Platelets 251  150 - 400 K/uL  RH IG WORKUP (INCLUDES ABO/RH)     Status: None   Collection Time    04/07/14  8:50 PM      Result Value Ref Range   Gestational Age(Wks) 5     ABO/RH(D) O NEG     Antibody Screen NEG     Unit Number [id number]     Blood Component Type RHIG     Unit division 00     Status of Unit ISSUED     Transfusion Status OK TO TRANSFUSE    HCG, QUANTITATIVE, PREGNANCY     Status: Abnormal  Collection Time    04/07/14  8:50 PM      Result Value Ref Range   hCG, Beta Chain, Quant, S 27 (*) <5 mIU/mL   US Ob Comp Less 14  Wks  04/07/2014   CLINICAL DATA:  Uterine bleeding.  EXAM: OBSTETRIC <14 WK [place] AND TRANSVAGINAL OB US  TECHNIQUE: Both transabdominal and transvaginal ultrasound examinations were performed for complete evaluation of the gestation as well as the maternal uterus, adnexal regions, and pelvic cul-de-sac. Transvaginal technique was performed to assess early pregnancy.  COMPARISON:  None.  FINDINGS: Intrauterine gestational sac: None  Yolk sac:  No  Embryo:  No  Cardiac Activity: No normal  Maternal uterus/adnexae: Corpus luteum cyst on the otherwise normal right ovary. Left ovary is normal. No free fluid.  Endometrial thickness is 8.6 mm. No visible retained products of conception.  IMPRESSION: No evidence of intrauterine or ectopic pregnancy. Corpus luteum cyst on the right ovary.   Electronically Signed   By: Rozetta Nunnery M.D.   On: 04/07/2014 23:37   US Ob Transvaginal  04/07/2014   CLINICAL DATA:  Uterine bleeding.  EXAM: OBSTETRIC <14 WK [place] AND TRANSVAGINAL OB US  TECHNIQUE: Both transabdominal and transvaginal ultrasound examinations were performed for complete evaluation of the gestation as well as the maternal uterus, adnexal regions, and pelvic cul-de-sac. Transvaginal technique was performed to assess early pregnancy.  COMPARISON:  None.  FINDINGS: Intrauterine gestational sac: None  Yolk sac:  No  Embryo:  No  Cardiac Activity: No normal  Maternal uterus/adnexae: Corpus luteum cyst on the otherwise normal right ovary. Left ovary is normal. No free fluid.  Endometrial thickness is 8.6 mm. No visible retained products of conception.  IMPRESSION: No evidence of intrauterine or ectopic pregnancy. Corpus luteum cyst on the right ovary.   Electronically Signed   By: Rozetta Nunnery M.D.   On: 04/07/2014 23:37     MAU Course  Procedures None  MDM UPT - negative Will confirm with quant hCG Also CBC, rhogam work-up today [place] if quant hCG is positive Rhogam given today Discussed with patient and husband  high likely of SAB in progress.  Assessment and Plan  A: Threatened AB   P: Discharge home Bleeding/ectopic precautions discussed Advised Tylenol PRN for pain Return to MAU in 48 hours for follw-up labs or sooner if symptoms change or worsen   Farris Has, PA-C  04/08/2014, 1:01 AM

## 2014-04-07 NOTE — Discharge Instructions (Signed)
Pelvic Rest °Pelvic rest is sometimes recommended for women when:  °· The placenta is partially or completely covering the opening of the cervix (placenta previa). °· There is bleeding between the uterine wall and the amniotic sac in the first trimester (subchorionic hemorrhage). °· The cervix begins to open without labor starting (incompetent cervix, cervical insufficiency). °· The labor is too early (preterm labor). °HOME CARE INSTRUCTIONS °· Do not have sexual intercourse, stimulation, or an orgasm. °· Do not use tampons, douche, or put anything in the vagina. °· Do not lift anything over 10 pounds (4.5 kg). °· Avoid strenuous activity or straining your pelvic muscles. °SEEK MEDICAL CARE IF:  °· You have any vaginal bleeding during pregnancy. Treat this as a potential emergency. °· You have cramping pain felt low in the stomach (stronger than menstrual cramps). °· You notice vaginal discharge (watery, mucus, or bloody). °· You have a low, dull backache. °· There are regular contractions or uterine tightening. °SEEK IMMEDIATE MEDICAL CARE IF: °You have vaginal bleeding and have placenta previa.  °Document Released: 02/27/2011 Document Revised: 01/25/2012 Document Reviewed: 02/27/2011 °ExitCare® Patient Information ©2014 [employer]. ° °Threatened Miscarriage ° A threatened miscarriage is a pregnancy that may end. It may be marked by bleeding during the first 20 weeks of pregnancy. Often, the pregnancy can continue without any more problems. You may be asked to stop: °· Having sex (intercourse). °· Having orgasms. °· Using tampons. °· Exercising. °· Doing heavy physical activity and work. °HOME CARE  °· Your doctor may tell you to take bed rest and to stop activities and work. °· Write down the number of pads you use each day. Write down how often you change pads. Write down how soaked they are. °· Follow your doctor's advice for follow-up visits and tests. °· If your blood type is Rh-negative and the father's  blood is Rh-positive (or is not known), you may get a shot to protect the baby. °· If you have a miscarriage, save all the tissue you pass in a container. Take the container to your doctor. °GET HELP RIGHT AWAY IF:  °· You have bad cramps or pain in your belly (abdomen), lower belly, or back. °· You have a fever or chills. °· Your bleeding gets worse or you pass large clots of blood or tissue. Save this tissue to show your doctor. °· You feel lightheaded, weak, dizzy, or pass out (faint). °· You have a gush of fluid from your vagina. °MAKE SURE YOU:  °· Understand these instructions. °· Will watch your condition. °· Will get help right away if you are not doing well or get worse. °Document Released: 10/15/2008 Document Revised: 01/25/2012 Document Reviewed: 11/18/2009 °ExitCare® Patient Information ©2014 [employer]. ° °

## 2014-04-07 NOTE — MAU Note (Signed)
I'm 5wks 5days preg. Spotting since Weds. Today having more red bleeding where spotting was brown. Passing some small, brown clots. Had some stomach pain but think it was gas.

## 2014-04-08 LAB — RH IG WORKUP (INCLUDES ABO/RH)
ABO/RH(D): O NEG
Antibody Screen: NEGATIVE
GESTATIONAL AGE(WKS): 5
Unit division: 0

## 2014-04-08 NOTE — MAU Provider Note (Signed)
Attestation of Attending Supervision of Advanced Practitioner (PA/CNM/NP): Evaluation and management procedures were performed by the Advanced Practitioner under my supervision and collaboration.  I have reviewed the Advanced Practitioner's note and chart, and I agree with the management and plan.  Harald Quevedo, MD, FACOG Attending Obstetrician & Gynecologist Faculty Practice, [facility]  

## 2014-04-08 NOTE — Progress Notes (Signed)
Rozelle Logan PA in to see pt. To discuss test results. Written and verbal d/c instructions given and understanding voiced.

## 2014-04-09 ENCOUNTER — Inpatient Hospital Stay (HOSPITAL_COMMUNITY)
Admission: AD | Admit: 2014-04-09 | Discharge: 2014-04-09 | Disposition: A | Discharge status: Home / Self Care | Payer: BC Managed Care – PPO | Source: Ambulatory Visit | Attending: Obstetrics & Gynecology | Admitting: Obstetrics & Gynecology

## 2014-04-09 DIAGNOSIS — O039 Complete or unspecified spontaneous abortion without complication: Secondary | ICD-10-CM | POA: Diagnosis not present

## 2014-04-09 LAB — GC/CHLAMYDIA PROBE AMP
CT Probe RNA: NEGATIVE
GC PROBE AMP APTIMA: NEGATIVE

## 2014-04-09 LAB — HCG, QUANTITATIVE, PREGNANCY: hCG, Beta Chain, Quant, S: 17 m[IU]/mL — ABNORMAL HIGH (ref <5)

## 2014-04-09 NOTE — MAU Note (Signed)
States vaginal bleeding has increased in the last 2 days; feels like normal period bleeding. States doesn't "feel pregnant" anymore. Denies feelings of dizziness or lightheadedness.

## 2014-04-09 NOTE — Discharge Instructions (Signed)
Incomplete Miscarriage A miscarriage is the sudden loss of an unborn baby (fetus) before the 20th week of pregnancy. In an incomplete miscarriage, parts of the fetus or placenta (afterbirth) remain in the body.  Having a miscarriage can be an emotional experience. Talk with your health care provider about any questions you may have about miscarrying, the grieving process, and your future pregnancy plans. CAUSES   Problems with the fetal chromosomes that make it impossible for the baby to develop normally. Problems with the baby's genes or chromosomes are most often the result of errors that occur by chance as the embryo divides and grows. The problems are not inherited from the parents.  Infection of the cervix or uterus.  Hormone problems.  Problems with the cervix, such as having an incompetent cervix. This is when the tissue in the cervix is not strong enough to hold the pregnancy.  Problems with the uterus, such as an abnormally shaped uterus, uterine fibroids, or congenital abnormalities.  Certain medical conditions.  Smoking, drinking alcohol, or taking illegal drugs.  Trauma. SYMPTOMS   Vaginal bleeding or spotting, with or without cramps or pain.  Pain or cramping in the abdomen or lower back.  Passing fluid, tissue, or blood clots from the vagina. DIAGNOSIS  Your health care provider will perform a physical exam. You may also have an ultrasound to confirm the miscarriage. Blood or urine tests may also be ordered. TREATMENT   Usually, a dilation and curettage (D&C) procedure is performed. During a D&C procedure, the cervix is widened (dilated) and any remaining fetal or placental tissue is gently removed from the uterus.  Antibiotic medicines are prescribed if there is an infection. Other medicines may be given to reduce the size of the uterus (contract) if there is a lot of bleeding.  If you have Rh negative blood and your baby was Rh positive, you will need an Rho(D)  immune globulin shot. This shot will protect any future baby from having Rh blood problems in future pregnancies.  You may be confined to bed rest. This means you should stay in bed and only get up to use the bathroom. HOME CARE INSTRUCTIONS   Rest as directed by your health care provider.  Restrict activity as directed by your health care provider. You may be allowed to continue light activity if curettage was not done but you require further treatment.  Keep track of the number of pads you use each day. Keep track of how soaked (saturated) they are. Record this information.  Do not  use tampons.  Do not douche or have sexual intercourse until approved by your health care provider.  Keep all follow-up appointments for re-evaluation and continuing management.  Only take over-the-counter or prescription medicines for pain, fever, or discomfort as directed by your health care provider.  Take antibiotic medicine as directed by your health care provider. Make sure you finish it even if you start to feel better. SEEK IMMEDIATE MEDICAL CARE IF:   You experience severe cramps in your stomach, back, or abdomen.  You have an unexplained temperature (make sure to record these temperatures).  You pass large clots or tissue (save these for your health care provider to inspect).  Your bleeding increases.  You become light-headed, weak, or have fainting episodes. MAKE SURE YOU:   Understand these instructions.  Will watch your condition.  Will get help right away if you are not doing well or get worse. Document Released: 11/02/2005 Document Revised: 08/23/2013 Document Reviewed: 06/01/2013  ExitCare Patient Information 2014 [employer].

## 2014-04-09 NOTE — MAU Provider Note (Signed)
First Provider Initiated Contact with Patient 04/09/14 2040     Ms. Catherine Mckenzie is a 33 y.o. G2P1001 at [redacted]w[redacted]d who presents to MAU today for follow-up quant hCG. She was seen on 04/07/14 for vaginal bleeding. US showed nothing measurable in the uterus and quant hCG was 27. She states that bleeding has increased to be similar to a normal period today. She denies pain, weakness or dizziness.   BP 131/65  Pulse 88  Temp(Src) 97.7 F (36.5 C) (Oral)  Resp 18  SpO2 100%  LMP 02/26/2014 GENERAL: Well-developed, well-nourished female in no acute distress.  HEENT: Normocephalic, atraumatic.   LUNGS: Effort normal HEART: Regular rate  SKIN: Warm, dry and without erythema PSYCH: Normal mood and affect  Results for ROSENDA, GEFFRARD (MRN [id number]) as of 04/09/2014 20:43  Ref. Range 04/07/2014 20:50 04/07/2014 23:08 04/09/2014 19:45  hCG, Beta Chain, Quant, S Latest Range: <5 mIU/mL 27 (H)  17 (H)    MDM Discussed with Dr. Harolyn Rutherford. Bleeding precautions. Follow-up at [facility] in 1 week.  Patient given support packet  A: SAB  P: Discharge home Bleeding precautions discussed Patient advised to call [facility] to change follow-up appointment to next Monday Patient may return to MAU as needed or if her condition were to change or worsen  Farris Has, PA-C 04/09/2014 8:42 PM

## 2014-04-10 ENCOUNTER — Encounter: Payer: BC Managed Care – PPO | Admitting: Family Medicine

## 2014-04-10 NOTE — MAU Provider Note (Signed)
Attestation of Attending Supervision of Advanced Practitioner (PA/CNM/NP): Evaluation and management procedures were performed by the Advanced Practitioner under my supervision and collaboration.  I have reviewed the Advanced Practitioner's note and chart, and I agree with the management and plan.  Shelton Soler, MD, FACOG Attending Obstetrician & Gynecologist Faculty Practice, [facility]  

## 2014-04-16 ENCOUNTER — Ambulatory Visit (INDEPENDENT_AMBULATORY_CARE_PROVIDER_SITE_OTHER): Payer: BC Managed Care – PPO | Admitting: Family Medicine

## 2014-04-16 ENCOUNTER — Encounter: Payer: Self-pay | Admitting: Family Medicine

## 2014-04-16 VITALS — BP 112/71 | HR 66 | Ht 67.0 in | Wt 184.0 lb

## 2014-04-16 DIAGNOSIS — O039 Complete or unspecified spontaneous abortion without complication: Secondary | ICD-10-CM

## 2014-04-16 NOTE — Progress Notes (Signed)
    Subjective:    Patient ID: Catherine Mckenzie is a 33 y.o. female presenting with missed ab  on 04/16/2014  HPI: S/p SAB 1 wk ago, with bleeding and falling Quants.  She no longer feels pregnant.  Has stopped bleeding.  Has a cousin with Factor V Leiden mutation.  No family history of clots.  SAB occurred at 5 wks.  Review of Systems  Constitutional: Negative for fever and chills.  Gastrointestinal: Negative for abdominal pain.  Genitourinary: Negative for vaginal bleeding.  Skin: Negative for rash.      Objective:    BP 112/71  Pulse 66  Ht 5\' 7"  (1.702 m)  Wt 184 lb (83.462 kg)  BMI 28.81 kg/m2  LMP 02/26/2014 Physical Exam  Vitals reviewed. Constitutional: She appears well-developed and well-nourished. No distress.  Eyes: No scleral icterus.  Neck: Neck supple.  Cardiovascular: Normal rate.   Pulmonary/Chest: Effort normal. No respiratory distress.  Abdominal: Soft. There is no tenderness.        Assessment & Plan:  SAB (spontaneous abortion) Complete--advised to continue PNV's.   Return if symptoms worsen or fail to improve.

## 2014-04-16 NOTE — Patient Instructions (Signed)
Preparing for Pregnancy Preparing for pregnancy (preconceptual care) by getting counseling and information from your caregiver before getting pregnant is a good idea. It will help you and your baby have a better chance to have a healthy, safe pregnancy and delivery of your baby. Make an appointment with your caregiver to talk about your health, medical, and family history and how to prepare yourself before getting pregnant. Your caregiver will do a complete physical exam and a Pap test. They will want to know:  About you, your spouse or partner, and your family's medical and genetic history.  If you are eating a balanced diet and drinking enough fluids.  What vitamins and mineral supplements you are taking. This includes taking folic acid before getting pregnant to help prevent birth defects.  What medications you are taking including prescription, over-the-counter and herbal medications.  If there is any substance abuse like alcohol, smoking, and illegal drugs.  If there is any mental or physical domestic violence.  If there is any risk of sexually transmitted disease between you and your partner.  What immunizations and vaccinations you have had and what you may need before getting pregnant.  If you should get tested for HIV infection.  If there is any exposure to chemical or toxic substances at home or work.  If there are medical problems you have that need to be treated and kept under control before getting pregnant such as diabetes, high blood pressure or others.  If there were any past surgeries, pregnancies and problems with them.  What your current weight is and to set a goal as to how much weight you should gain while pregnant. Also, they will check if you should lose or gain weight before getting pregnant.  What is your exercise routine and what it is safe when you are pregnant.  If there are any physical disabilities that need to be addressed.  About spacing your  pregnancies when there are other children.  If there is a financial problem that may affect you having a child. After talking about the above points with your caregiver, your caregiver will give you advice on how to help treat and work with you on solving any issues, if necessary, before getting pregnant. The goal is to have a healthy and safe pregnancy for you and your baby. You should keep an accurate record of your menstrual periods because it will help in determining your due date. Immunizations that you should have before getting pregnant:   Regular measles, German measles (rubella) and mumps.  Tetanus and diphtheria.  Chickenpox, if not immune.  Herpes zoster (Varicella) if not immune.  Human papilloma virus vaccine (HPV) between the age of 9 and 26 years old.  Hepatitis A vaccine.  Hepatitis B vaccine.  Influenza vaccine.  Pneumococcal vaccine (pneumonia). You should avoid getting pregnant for one month after getting vaccinated with a live virus vaccine such as German measles (rubella) which is in the MMR (Measles, Mumps and Rubella) vaccine. Other immunizations may be necessary depending on where you live, such as malaria. Ask your caregiver if any other immunizations are needed for you. HOME CARE INSTRUCTIONS   Follow the advice of your caregiver.  Before getting pregnant:  Begin taking vitamins, supplements, and 0.4 milligrams folic acid daily.  Get your immunizations up-to-date.  Get help from a nutrition counselor if you do not understand what a balanced diet is, need help with a special medical diet or if you need help to lose or gain weight.    Begin exercising.  Stop smoking, taking illegal drugs, and drinking alcoholic beverages.  Get counseling if there is and type of domestic violence.  Get checked for sexually transmitted diseases including HIV.  Get any medical problems under control (diabetes, high blood pressure, convulsions, asthma or  others).  Resolve any financial concerns or create a plan to do so.  Be sure you and your spouse or partner are ready to have a baby.  Keep an accurate record of your menstrual periods. Document Released: 10/15/2008 Document Revised: 08/23/2013 Document Reviewed: 10/15/2008 [facility] Patient Information 2014 [place]. Miscarriage A miscarriage is the sudden loss of an unborn baby (fetus) before the 20th week of pregnancy. Most miscarriages happen in the first 3 months of pregnancy. Sometimes, it happens before a woman even knows she is pregnant. A miscarriage is also called a "spontaneous miscarriage" or "early pregnancy loss." Having a miscarriage can be an emotional experience. Talk with your caregiver about any questions you may have about miscarrying, the grieving process, and your future pregnancy plans. CAUSES   Problems with the fetal chromosomes that make it impossible for the baby to develop normally. Problems with the baby's genes or chromosomes are most often the result of errors that occur, by chance, as the embryo divides and grows. The problems are not inherited from the parents.  Infection of the cervix or uterus.   Hormone problems.   Problems with the cervix, such as having an incompetent cervix. This is when the tissue in the cervix is not strong enough to hold the pregnancy.   Problems with the uterus, such as an abnormally shaped uterus, uterine fibroids, or congenital abnormalities.   Certain medical conditions.   Smoking, drinking alcohol, or taking illegal drugs.   Trauma.  Often, the cause of a miscarriage is unknown.  SYMPTOMS   Vaginal bleeding or spotting, with or without cramps or pain.  Pain or cramping in the abdomen or lower back.  Passing fluid, tissue, or blood clots from the vagina. DIAGNOSIS  Your caregiver will perform a physical exam. You may also have an ultrasound to confirm the miscarriage. Blood or urine tests may also be  ordered. TREATMENT   Sometimes, treatment is not necessary if you naturally pass all the fetal tissue that was in the uterus. If some of the fetus or placenta remains in the body (incomplete miscarriage), tissue left behind may become infected and must be removed. Usually, a dilation and curettage (D and C) procedure is performed. During a D and C procedure, the cervix is widened (dilated) and any remaining fetal or placental tissue is gently removed from the uterus.  Antibiotic medicines are prescribed if there is an infection. Other medicines may be given to reduce the size of the uterus (contract) if there is a lot of bleeding.  If you have Rh negative blood and your baby was Rh positive, you will need a Rh immunoglobulin shot. This shot will protect any future baby from having Rh blood problems in future pregnancies. HOME CARE INSTRUCTIONS   Your caregiver may order bed rest or may allow you to continue light activity. Resume activity as directed by your caregiver.  Have someone help with home and family responsibilities during this time.   Keep track of the number of sanitary pads you use each day and how soaked (saturated) they are. Write down this information.   Do not use tampons. Do not douche or have sexual intercourse until approved by your caregiver.   Only take  over-the-counter or prescription medicines for pain or discomfort as directed by your caregiver.   Do not take aspirin. Aspirin can cause bleeding.   Keep all follow-up appointments with your caregiver.   If you or your partner have problems with grieving, talk to your caregiver or seek counseling to help cope with the pregnancy loss. Allow enough time to grieve before trying to get pregnant again.  SEEK IMMEDIATE MEDICAL CARE IF:   You have severe cramps or pain in your back or abdomen.  You have a fever.  You pass large blood clots (walnut-sized or larger) ortissue from your vagina. Save any tissue for  your caregiver to inspect.   Your bleeding increases.   You have a thick, bad-smelling vaginal discharge.  You become lightheaded, weak, or you faint.   You have chills.  MAKE SURE YOU:  Understand these instructions.  Will watch your condition.  Will get help right away if you are not doing well or get worse. Document Released: 04/28/2001 Document Revised: 02/27/2013 Document Reviewed: 12/22/2011 [facility] Patient Information 2014 [place].

## 2014-04-25 ENCOUNTER — Other Ambulatory Visit: Payer: Self-pay | Admitting: Family Medicine

## 2014-05-21 ENCOUNTER — Ambulatory Visit (INDEPENDENT_AMBULATORY_CARE_PROVIDER_SITE_OTHER): Payer: BC Managed Care – PPO | Admitting: Obstetrics & Gynecology

## 2014-05-21 ENCOUNTER — Encounter: Payer: Self-pay | Admitting: Obstetrics & Gynecology

## 2014-05-21 ENCOUNTER — Ambulatory Visit (HOSPITAL_COMMUNITY)
Admission: RE | Admit: 2014-05-21 | Discharge: 2014-05-21 | Disposition: A | Discharge status: Home / Self Care | Payer: BC Managed Care – PPO | Source: Ambulatory Visit | Attending: Obstetrics & Gynecology | Admitting: Obstetrics & Gynecology

## 2014-05-21 VITALS — BP 119/73 | HR 93 | Wt 183.0 lb

## 2014-05-21 DIAGNOSIS — N831 Corpus luteum cyst of ovary, unspecified side: Secondary | ICD-10-CM | POA: Insufficient documentation

## 2014-05-21 DIAGNOSIS — IMO0002 Reserved for concepts with insufficient information to code with codable children: Secondary | ICD-10-CM

## 2014-05-21 DIAGNOSIS — Z3480 Encounter for supervision of other normal pregnancy, unspecified trimester: Secondary | ICD-10-CM

## 2014-05-21 DIAGNOSIS — Z3491 Encounter for supervision of normal pregnancy, unspecified, first trimester: Secondary | ICD-10-CM

## 2014-05-21 DIAGNOSIS — Z3689 Encounter for other specified antenatal screening: Secondary | ICD-10-CM | POA: Insufficient documentation

## 2014-05-21 DIAGNOSIS — Z789 Other specified health status: Secondary | ICD-10-CM

## 2014-05-21 DIAGNOSIS — O34599 Maternal care for other abnormalities of gravid uterus, unspecified trimester: Secondary | ICD-10-CM | POA: Insufficient documentation

## 2014-05-21 NOTE — Patient Instructions (Signed)
Trial of Labor After Cesarean Delivery Information A trial of labor after cesarean delivery (TOLAC) is when a woman tries to give birth vaginally after a previous cesarean delivery. TOLAC may be a safe and appropriate option for you depending on your medical history and other risk factors. When TOLAC is successful and you are able to have a vaginal delivery, this is called a vaginal birth after cesarean delivery (VBAC).  CANDIDATES FOR TOLAC TOLAC is possible for some women who:  Have undergone one or two prior cesarean deliveries in which the incision of the uterus was horizontal (low transverse).  Are carrying twins and have had one prior low transverse incision during a cesarean delivery.  Do not have a vertical (classical) uterine scar.  Have not had a tear in the wall of their uterus (uterine rupture). TOLAC is also supported for women who meet appropriate criteria and:  Are under the age of 40 years.  Are tall and have a body mass index (BMI) of less than 30.  Have an unknown uterine scar.  Give birth in a facility equipped to handle an emergency cesarean delivery. This team should be able to handle possible complications such as a uterine rupture.  Have thorough counseling about the benefits and risks of TOLAC.  Have discussed future pregnancy plans with their health care provider.  Plan to have several more pregnancies. MOST SUCCESSFUL CANDIDATES FOR TOLAC:  Have had a successful vaginal delivery before or after their cesarean delivery.  Experience labor that begins naturally on or before the due date (40 weeks of gestation).  Do not have a very large (macrosomic) baby.   Had a prior cesarean delivery but are not currently experiencing factors that would prompt a cesarean delivery (such as a breech position).  Had only one prior cesarean delivery.  Had a prior cesarean delivery that was performed early in labor and not after full cervical dilation. TOLAC may be most  appropriate for women who meet the above guidelines and who plan to have more pregnancies. TOLAC is not recommended for home births. LEAST SUCCESSFUL CANDIDATES FOR TOLAC:  Have an induced labor with an unfavorable cervix. An unfavorable cervix is when the cervix is not dilating enough (among other factors).  Have never had a vaginal delivery.  Have had more than two cesarean deliveries.  Have a pregnancy at more than 40 weeks of gestation.  Are pregnant with a baby with a suspected weight greater than 4,000 grams (8 pounds) and who have no prior history of a vaginal delivery.  Have closely spaced pregnancies. SUGGESTED BENEFITS OF TOLAC  You may have a faster recovery time.  You may have a shorter stay in the hospital.  You may have less pain and fewer problems than with a cesarean delivery. Women who have a cesarean delivery have a higher chance of needing blood or getting a fever, an infection, or a blood clot in the legs. SUGGESTED RISKS OF TOLAC The highest risk of complications happens to women who attempt a TOLAC and fail. A failed TOLAC results in an unplanned cesarean delivery. Risks related to TOLAC or repeat cesarean deliveries include:   Blood loss.  Infection.  Blood clot.  Injury to surrounding tissues or organs.  Having to remove the uterus (hysterectomy).  Potential problems with the placenta (such as placenta previa or placenta accreta) in future pregnancies. Although very rare, the main concerns with TOLAC are:  Rupture of the uterine scar from a past cesarean delivery.  Needing an   emergency cesarean delivery.  Having a bad outcome for the baby (perinatal morbidity). FOR MORE INFORMATION American Congress of Obstetricians and Gynecologists: www.acog.[place]: www.midwife.org Document Released: 07/21/2011 Document Revised: 08/23/2013 Document Reviewed: 04/24/2013 [facility] Patient Information 2015 [place], [place]. This  information is not intended to replace advice given to you by your health care provider. Make sure you discuss any questions you have with your health care provider.  Vaginal Birth After Cesarean Delivery Vaginal birth after cesarean delivery (VBAC) is giving birth vaginally after previously delivering a baby by a cesarean. In the past, if a woman had a cesarean delivery, all births afterwards would be done by cesarean delivery. This is no longer true. It can be safe for the mother to try a vaginal delivery after having a cesarean delivery.  It is important to discuss VBAC with your health care provider early in the pregnancy so you can understand the risks, benefits, and options. It will give you time to decide what is best in your particular case. The final decision about whether to have a VBAC or repeat cesarean delivery should be between you and your health care provider. Any changes in your health or your baby's health during your pregnancy may make it necessary to change your initial decision about VBAC.  WOMEN WHO PLAN TO HAVE A VBAC SHOULD CHECK WITH THEIR HEALTH CARE PROVIDER TO BE SURE THAT:  The previous cesarean delivery was done with a low transverse uterine cut (incision) (not a vertical classical incision).   The birth canal is big enough for the baby.   There were no other operations on the uterus.   An electronic fetal monitor (EFM) will be on at all times during labor.   An operating room will be available and ready in case an emergency cesarean delivery is needed.   A health care provider and surgical nursing staff will be available at all times during labor to be ready to do an emergency delivery cesarean if necessary.   An anesthesiologist will be present in case an emergency cesarean delivery is needed.   The nursery is prepared and has adequate personnel and necessary equipment available to care for the baby in case of an emergency cesarean delivery. BENEFITS OF  VBAC  Shorter stay in the hospital.   Avoidance of risks associated with cesarean delivery, such as:  Surgical complications, such as opening of the incision or hernia in the incision.  Injury to other organs.  Fever. This can occur if an infection develops after surgery. It can also occur as a reaction to the medicine given to make you numb during the surgery.  Less blood loss and need for blood transfusions.  Lower risk of blood clots and infection.  Shorter recovery.   Decreased risk for having to remove the uterus (hysterectomy).   Decreased risk for the placenta to completely or partially cover the opening of the uterus (placenta previa) with a future pregnancy.   Decrease risk in future labor and delivery. RISKS OF A VBAC  Tearing (rupture) of the uterus. This is occurs in less than 1% of VBACs. The risk of this happening is higher if:  Steps are taken to begin the labor process (induce labor) or stimulate or strengthen contractions (augment labor).   Medicine is used to soften (ripen) the cervix.  Having to remove the uterus (hysterectomy) if it ruptures. VBAC SHOULD NOT BE DONE IF:  The previous cesarean delivery was done with a vertical (classical) or T-shaped  incision or you do not know what kind of incision was made.   You had a ruptured uterus.   You have had certain types of surgery on your uterus, such as removal of uterine fibroids. Ask your health care provider about other types of surgeries that prevent you from having a VBAC.  You have certain medical or childbirth (obstetrical) problems.   There are problems with the baby.   You have had two previous cesarean deliveries and no vaginal deliveries. OTHER FACTS TO KNOW ABOUT VBAC:  It is safe to have an epidural anesthetic with VBAC.   It is safe to turn the baby from a breech position (attempt an external cephalic version).   It is safe to try a VBAC with twins.   VBAC may not be  successful if your baby weights 8.8 lb (4 kg) or more. However, weight predictions are not always accurate and should not be used alone to decide if VBAC is right for you.  There is an increased failure rate if the time between the cesarean delivery and VBAC is less than 19 months.   Your health care provider may advise against a VBAC if you have preeclampsia (high blood pressure, protein in the urine, and swelling of face and extremities).   VBAC is often successful if you previously gave birth vaginally.   VBAC is often successful when the labor starts spontaneously before the due date.   Delivering a baby through a VBAC is similar to having a normal spontaneous vaginal delivery. Document Released: 04/25/2007 Document Revised: 08/23/2013 Document Reviewed: 06/01/2013 [facility] Patient Information 2015 [place], [place]. This information is not intended to replace advice given to you by your health care provider. Make sure you discuss any questions you have with your health care provider.

## 2014-05-21 NOTE — Progress Notes (Signed)
    Subjective:    Catherine Mckenzie is a 33 y.o. G3P1011at [redacted]w[redacted]d being seen today for her first obstetrical visit.  Her obstetrical history is significant for recent SAB; previous term cesarean section.. Patient does intend to breast feed. Pregnancy history fully reviewed.  Patient reports no complaints.  Filed Vitals:   05/21/14 1545  BP: 119/73  Pulse: 93  Weight: 183 lb (83.008 kg)    HISTORY: OB History  Gravida Para Term Preterm AB SAB TAB Ectopic Multiple Living  3 1 1  1 1    1     # Outcome Date GA Lbr Len/2nd Weight Sex Delivery Anes PTL Lv  3 CUR           2 TRM 01/27/11 [redacted]w[redacted]d   M LTCS  N   1 SAB              Comments: System Generated. Please review and update pregnancy details.     Past Medical History  Diagnosis Date  . Fibroadenoma     left breast  . Thyroid disease   . Allergy   . Migraine headache without aura    Past Surgical History  Procedure Laterality Date  . Cesarean section    . Wisdom tooth extraction      x4   Family History  Problem Relation Age of Onset  . Diabetes Mother   . Hypertension Mother   . Hyperlipidemia Mother   . Stroke Mother     TIA  . Asthma Sister   . Depression Sister   . Cancer Maternal Grandmother 50    PANCREATIC  . Heart disease Maternal Grandfather   . Hypertension Maternal Grandfather   . Hypertension Paternal Grandmother   . Cancer Paternal Grandmother     BREAST  . Stroke Paternal Grandmother   . Cancer Paternal Grandfather     PROSTATE  . Seizures Paternal Grandfather   . Hypertension Paternal Grandfather   . Stroke Paternal Grandfather     Exam  BP 119/73  Pulse 93  Wt 183 lb (83.008 kg)  LMP 04/06/2014  Breastfeeding? Unknown Physical exam deferred. Normal pap and negative HRHPV in 03/2013. Clinic ultrasound: No pregnancy visualized in uterus. Will send to [facility] for formal scan.   Assessment:   Pregnancy: G3P1011 Patient Active Problem List   Diagnosis Date Noted  . Celiac sprue 04/03/2013    . Obesity 07/05/2012  . Migraine headache without aura   . ALLERGIC RHINITIS 01/19/2008  . HYPOTHYROIDISM 04/27/2007  . [place], BREAST 04/27/2007  . ACNE NEC 04/27/2007    Plan:   Will follow up viability scan results and manage accordingly Patient is interested in quad screen   Osborne Oman, MD

## 2014-05-25 ENCOUNTER — Encounter: Payer: Self-pay | Admitting: Family Medicine

## 2014-05-25 ENCOUNTER — Ambulatory Visit (INDEPENDENT_AMBULATORY_CARE_PROVIDER_SITE_OTHER): Payer: BC Managed Care – PPO | Admitting: Family Medicine

## 2014-05-25 VITALS — BP 127/85 | HR 103 | Wt 180.0 lb

## 2014-05-25 DIAGNOSIS — O200 Threatened abortion: Secondary | ICD-10-CM | POA: Insufficient documentation

## 2014-05-25 DIAGNOSIS — E079 Disorder of thyroid, unspecified: Secondary | ICD-10-CM | POA: Insufficient documentation

## 2014-05-25 DIAGNOSIS — O99280 Endocrine, nutritional and metabolic diseases complicating pregnancy, unspecified trimester: Secondary | ICD-10-CM

## 2014-05-25 DIAGNOSIS — Z3481 Encounter for supervision of other normal pregnancy, first trimester: Secondary | ICD-10-CM

## 2014-05-25 DIAGNOSIS — O99281 Endocrine, nutritional and metabolic diseases complicating pregnancy, first trimester: Secondary | ICD-10-CM

## 2014-05-25 DIAGNOSIS — Z3480 Encounter for supervision of other normal pregnancy, unspecified trimester: Secondary | ICD-10-CM

## 2014-05-25 NOTE — Progress Notes (Signed)
Here for spotting.  She has had normal [place] 4 days ago.  Bright red bleeding last pm.  To spotting today.  Recent miscarriage and she is nervous. Limited OB u/s reveals SIUP and + flicker Threatened AB--precautions given.

## 2014-05-25 NOTE — Patient Instructions (Signed)

## 2014-05-25 NOTE — Progress Notes (Signed)
Had bright red spotting last night, now brown discharge.  Had ultrasound Monday at [facility]. Still having nausea

## 2014-06-18 ENCOUNTER — Encounter: Payer: BC Managed Care – PPO | Admitting: Family Medicine

## 2014-06-18 ENCOUNTER — Ambulatory Visit (INDEPENDENT_AMBULATORY_CARE_PROVIDER_SITE_OTHER): Payer: BC Managed Care – PPO | Admitting: Family Medicine

## 2014-06-18 VITALS — BP 126/79 | HR 102 | Wt 181.0 lb

## 2014-06-18 DIAGNOSIS — Z3480 Encounter for supervision of other normal pregnancy, unspecified trimester: Secondary | ICD-10-CM

## 2014-06-18 NOTE — Progress Notes (Signed)
Considering Panorama--will return in 2 wks for draw Still with some nausea.

## 2014-06-26 ENCOUNTER — Other Ambulatory Visit: Payer: BC Managed Care – PPO

## 2014-07-06 ENCOUNTER — Telehealth: Payer: Self-pay | Admitting: *Deleted

## 2014-07-06 NOTE — Telephone Encounter (Signed)
Called patient to give panorama results.  Unable to leave message for patient due to her mailbox is full.

## 2014-07-09 ENCOUNTER — Telehealth: Payer: Self-pay | Admitting: *Deleted

## 2014-07-09 NOTE — Telephone Encounter (Signed)
Patient notified of Panorama test results that were WNL and that the sex of the baby is female.

## 2014-07-16 ENCOUNTER — Encounter: Payer: Self-pay | Admitting: Obstetrics & Gynecology

## 2014-07-16 ENCOUNTER — Ambulatory Visit (INDEPENDENT_AMBULATORY_CARE_PROVIDER_SITE_OTHER): Payer: BC Managed Care – PPO | Admitting: Obstetrics & Gynecology

## 2014-07-16 VITALS — BP 132/83 | HR 109 | Wt 183.6 lb

## 2014-07-16 DIAGNOSIS — E039 Hypothyroidism, unspecified: Secondary | ICD-10-CM

## 2014-07-16 DIAGNOSIS — E079 Disorder of thyroid, unspecified: Secondary | ICD-10-CM

## 2014-07-16 DIAGNOSIS — O99280 Endocrine, nutritional and metabolic diseases complicating pregnancy, unspecified trimester: Secondary | ICD-10-CM

## 2014-07-16 DIAGNOSIS — Z3480 Encounter for supervision of other normal pregnancy, unspecified trimester: Secondary | ICD-10-CM

## 2014-07-16 DIAGNOSIS — Z3482 Encounter for supervision of other normal pregnancy, second trimester: Secondary | ICD-10-CM

## 2014-07-16 DIAGNOSIS — O99282 Endocrine, nutritional and metabolic diseases complicating pregnancy, second trimester: Principal | ICD-10-CM

## 2014-07-16 DIAGNOSIS — J45909 Unspecified asthma, uncomplicated: Secondary | ICD-10-CM

## 2014-07-16 DIAGNOSIS — J4520 Mild intermittent asthma, uncomplicated: Secondary | ICD-10-CM

## 2014-07-16 MED ORDER — IPRATROPIUM-ALBUTEROL 20-100 MCG/ACT IN AERS: 1.0000 | INHALATION_SPRAY | Freq: Four times a day (QID) | RESPIRATORY_TRACT | Status: DC

## 2014-07-16 NOTE — Patient Instructions (Signed)
Return to clinic for any obstetric concerns or go to [facility] for evaluation  

## 2014-07-16 NOTE — Progress Notes (Signed)
Panorama testing showed normal female (XY), patient is very happy. Anatomy scan scheduled. Patient has chronic cough and bronchial symptoms for about two weeks; coughing a lot. Taking Zyrtec,Tylenol, cough drops, no formal antitussives. Lungs clear on exam; recommended OTC antitussives, Combivent prescribed. No need for antibiotics for now. Appointment with PCP who manages hypothyroidism soon; they will check thyroid panel and adjust medications as needed. No other complaints or concerns.  Routine obstetric precautions reviewed.

## 2014-07-25 ENCOUNTER — Encounter: Payer: Self-pay | Admitting: Obstetrics and Gynecology

## 2014-07-25 ENCOUNTER — Ambulatory Visit (INDEPENDENT_AMBULATORY_CARE_PROVIDER_SITE_OTHER): Payer: BC Managed Care – PPO | Admitting: Obstetrics and Gynecology

## 2014-07-25 VITALS — BP 128/85 | HR 112 | Wt 176.0 lb

## 2014-07-25 DIAGNOSIS — O99280 Endocrine, nutritional and metabolic diseases complicating pregnancy, unspecified trimester: Secondary | ICD-10-CM

## 2014-07-25 DIAGNOSIS — O99282 Endocrine, nutritional and metabolic diseases complicating pregnancy, second trimester: Secondary | ICD-10-CM

## 2014-07-25 DIAGNOSIS — Z3482 Encounter for supervision of other normal pregnancy, second trimester: Secondary | ICD-10-CM

## 2014-07-25 DIAGNOSIS — E079 Disorder of thyroid, unspecified: Secondary | ICD-10-CM

## 2014-07-25 DIAGNOSIS — Z3480 Encounter for supervision of other normal pregnancy, unspecified trimester: Secondary | ICD-10-CM

## 2014-07-25 NOTE — Progress Notes (Signed)
She is concerned that the rash is getting into her eye.  She is also having significant increased back pain that hurts even when she is lying down.  She has lost 7 pounds in one week.

## 2014-07-25 NOTE — Progress Notes (Signed)
Patient here to be reassured as her son was diagnosed with hand-foot and mouth disease. She developed oral blisters last Friday and states that her oral pain is getting better. She declined a lidocaine mouthwash. Patient is keeping well hydrated and is taking tylenol prn. Patient reports right sided flank pain. No CVA tenderness on exam. Pain is reproducible over ribs-maybe musculoskeletal. Will check a urine culture. Continue supportive care and rtc as scheduled in October or prn

## 2014-07-27 LAB — CULTURE, OB URINE: Colony Count: 30000

## 2014-08-14 ENCOUNTER — Telehealth: Payer: Self-pay | Admitting: *Deleted

## 2014-08-14 MED ORDER — THYROID 60 MG PO TABS: 120.0000 mg | ORAL_TABLET | Freq: Every day | ORAL | Status: DC

## 2014-08-14 NOTE — Telephone Encounter (Signed)
Patient needs refill of her thyroid medication.

## 2014-08-20 ENCOUNTER — Ambulatory Visit (HOSPITAL_COMMUNITY)
Admission: RE | Admit: 2014-08-20 | Discharge: 2014-08-20 | Disposition: A | Discharge status: Home / Self Care | Payer: BC Managed Care – PPO | Source: Ambulatory Visit | Attending: Obstetrics & Gynecology | Admitting: Obstetrics & Gynecology

## 2014-08-20 DIAGNOSIS — O4402 Placenta previa specified as without hemorrhage, second trimester: Secondary | ICD-10-CM

## 2014-08-20 DIAGNOSIS — E079 Disorder of thyroid, unspecified: Secondary | ICD-10-CM | POA: Diagnosis not present

## 2014-08-20 DIAGNOSIS — Z3A19 19 weeks gestation of pregnancy: Secondary | ICD-10-CM

## 2014-08-20 DIAGNOSIS — O99282 Endocrine, nutritional and metabolic diseases complicating pregnancy, second trimester: Secondary | ICD-10-CM | POA: Insufficient documentation

## 2014-08-20 DIAGNOSIS — Z3482 Encounter for supervision of other normal pregnancy, second trimester: Secondary | ICD-10-CM | POA: Diagnosis not present

## 2014-08-20 DIAGNOSIS — O09522 Supervision of elderly multigravida, second trimester: Secondary | ICD-10-CM

## 2014-08-20 DIAGNOSIS — Z3689 Encounter for other specified antenatal screening: Secondary | ICD-10-CM

## 2014-08-21 ENCOUNTER — Ambulatory Visit (INDEPENDENT_AMBULATORY_CARE_PROVIDER_SITE_OTHER): Payer: BC Managed Care – PPO | Admitting: Family Medicine

## 2014-08-21 VITALS — BP 132/72 | HR 96 | Wt 184.0 lb

## 2014-08-21 DIAGNOSIS — Z23 Encounter for immunization: Secondary | ICD-10-CM | POA: Diagnosis not present

## 2014-08-21 DIAGNOSIS — E034 Atrophy of thyroid (acquired): Secondary | ICD-10-CM

## 2014-08-21 DIAGNOSIS — O99282 Endocrine, nutritional and metabolic diseases complicating pregnancy, second trimester: Secondary | ICD-10-CM

## 2014-08-21 DIAGNOSIS — Z3482 Encounter for supervision of other normal pregnancy, second trimester: Secondary | ICD-10-CM

## 2014-08-21 DIAGNOSIS — E079 Disorder of thyroid, unspecified: Secondary | ICD-10-CM

## 2014-08-21 DIAGNOSIS — E038 Other specified hypothyroidism: Secondary | ICD-10-CM

## 2014-08-21 NOTE — Progress Notes (Signed)
incomplete anatomy--needs f/u Needs TSH checked Flu shots

## 2014-08-21 NOTE — Patient Instructions (Signed)
Second Trimester of Pregnancy The second trimester is from week 13 through week 28, months 4 through 6. The second trimester is often a time when you feel your best. Your body has also adjusted to being pregnant, and you begin to feel better physically. Usually, morning sickness has lessened or quit completely, you may have more energy, and you may have an increase in appetite. The second trimester is also a time when the fetus is growing rapidly. At the end of the sixth month, the fetus is about 9 inches long and weighs about 1 pounds. You will likely begin to feel the baby move (quickening) between 18 and 20 weeks of the pregnancy. BODY CHANGES Your body goes through many changes during pregnancy. The changes vary from woman to woman.   Your weight will continue to increase. You will notice your lower abdomen bulging out.  You may begin to get stretch marks on your hips, abdomen, and breasts.  You may develop headaches that can be relieved by medicines approved by your health care provider.  You may urinate more often because the fetus is pressing on your bladder.  You may develop or continue to have heartburn as a result of your pregnancy.  You may develop constipation because certain hormones are causing the muscles that push waste through your intestines to slow down.  You may develop hemorrhoids or swollen, bulging veins (varicose veins).  You may have back pain because of the weight gain and pregnancy hormones relaxing your joints between the bones in your pelvis and as a result of a shift in weight and the muscles that support your balance.  Your breasts will continue to grow and be tender.  Your gums may bleed and may be sensitive to brushing and flossing.  Dark spots or blotches (chloasma, mask of pregnancy) may develop on your face. This will likely fade after the baby is born.  A dark line from your belly button to the pubic area (linea nigra) may appear. This will likely  fade after the baby is born.  You may have changes in your hair. These can include thickening of your hair, rapid growth, and changes in texture. Some women also have hair loss during or after pregnancy, or hair that feels dry or thin. Your hair will most likely return to normal after your baby is born. WHAT TO EXPECT AT YOUR PRENATAL VISITS During a routine prenatal visit:  You will be weighed to make sure you and the fetus are growing normally.  Your blood pressure will be taken.  Your abdomen will be measured to track your baby's growth.  The fetal heartbeat will be listened to.  Any test results from the previous visit will be discussed. Your health care provider may ask you:  How you are feeling.  If you are feeling the baby move.  If you have had any abnormal symptoms, such as leaking fluid, bleeding, severe headaches, or abdominal cramping.  If you have any questions. Other tests that may be performed during your second trimester include:  Blood tests that check for:  Low iron levels (anemia).  Gestational diabetes (between 24 and 28 weeks).  Rh antibodies.  Urine tests to check for infections, diabetes, or protein in the urine.  An ultrasound to confirm the proper growth and development of the baby.  An amniocentesis to check for possible genetic problems.  Fetal screens for spina bifida and Down syndrome. HOME CARE INSTRUCTIONS   Avoid all smoking, herbs, alcohol, and unprescribed   drugs. These chemicals affect the formation and growth of the baby.  Follow your health care provider's instructions regarding medicine use. There are medicines that are either safe or unsafe to take during pregnancy.  Exercise only as directed by your health care provider. Experiencing uterine cramps is a good sign to stop exercising.  Continue to eat regular, healthy meals.  Wear a good support bra for breast tenderness.  Do not use hot tubs, steam rooms, or saunas.  Wear  your seat belt at all times when driving.  Avoid raw meat, uncooked cheese, cat litter boxes, and soil used by cats. These carry germs that can cause birth defects in the baby.  Take your prenatal vitamins.  Try taking a stool softener (if your health care provider approves) if you develop constipation. Eat more high-fiber foods, such as fresh vegetables or fruit and whole grains. Drink plenty of fluids to keep your urine clear or pale yellow.  Take warm sitz baths to soothe any pain or discomfort caused by hemorrhoids. Use hemorrhoid cream if your health care provider approves.  If you develop varicose veins, wear support hose. Elevate your feet for 15 minutes, 3-4 times a day. Limit salt in your diet.  Avoid heavy lifting, wear low heel shoes, and practice good posture.  Rest with your legs elevated if you have leg cramps or low back pain.  Visit your dentist if you have not gone yet during your pregnancy. Use a soft toothbrush to brush your teeth and be gentle when you floss.  A sexual relationship may be continued unless your health care provider directs you otherwise.  Continue to go to all your prenatal visits as directed by your health care provider. SEEK MEDICAL CARE IF:   You have dizziness.  You have mild pelvic cramps, pelvic pressure, or nagging pain in the abdominal area.  You have persistent nausea, vomiting, or diarrhea.  You have a bad smelling vaginal discharge.  You have pain with urination. SEEK IMMEDIATE MEDICAL CARE IF:   You have a fever.  You are leaking fluid from your vagina.  You have spotting or bleeding from your vagina.  You have severe abdominal cramping or pain.  You have rapid weight gain or loss.  You have shortness of breath with chest pain.  You notice sudden or extreme swelling of your face, hands, ankles, feet, or legs.  You have not felt your baby move in over an hour.  You have severe headaches that do not go away with  medicine.  You have vision changes. Document Released: 10/27/2001 Document Revised: 11/07/2013 Document Reviewed: 01/03/2013 [employer] Patient Information 2015 [employer]. This information is not intended to replace advice given to you by your health care provider. Make sure you discuss any questions you have with your health care provider.  Breastfeeding Deciding to breastfeed is one of the best choices you can make for you and your baby. A change in hormones during pregnancy causes your breast tissue to grow and increases the number and size of your milk ducts. These hormones also allow proteins, sugars, and fats from your blood supply to make breast milk in your milk-producing glands. Hormones prevent breast milk from being released before your baby is born as well as prompt milk flow after birth. Once breastfeeding has begun, thoughts of your baby, as well as his or her sucking or crying, can stimulate the release of milk from your milk-producing glands.  BENEFITS OF BREASTFEEDING For Your Baby  Your first   milk (colostrum) helps your baby's digestive system function better.   There are antibodies in your milk that help your baby fight off infections.   Your baby has a lower incidence of asthma, allergies, and sudden infant death syndrome.   The nutrients in breast milk are better for your baby than infant formulas and are designed uniquely for your baby's needs.   Breast milk improves your baby's brain development.   Your baby is less likely to develop other conditions, such as childhood obesity, asthma, or type 2 diabetes mellitus.  For You   Breastfeeding helps to create a very special bond between you and your baby.   Breastfeeding is convenient. Breast milk is always available at the correct temperature and costs nothing.   Breastfeeding helps to burn calories and helps you lose the weight gained during pregnancy.   Breastfeeding makes your uterus contract to its  prepregnancy size faster and slows bleeding (lochia) after you give birth.   Breastfeeding helps to lower your risk of developing type 2 diabetes mellitus, osteoporosis, and breast or ovarian cancer later in life. SIGNS THAT YOUR BABY IS HUNGRY Early Signs of Hunger  Increased alertness or activity.  Stretching.  Movement of the head from side to side.  Movement of the head and opening of the mouth when the corner of the mouth or cheek is stroked (rooting).  Increased sucking sounds, smacking lips, cooing, sighing, or squeaking.  Hand-to-mouth movements.  Increased sucking of fingers or hands. Late Signs of Hunger  Fussing.  Intermittent crying. Extreme Signs of Hunger Signs of extreme hunger will require calming and consoling before your baby will be able to breastfeed successfully. Do not wait for the following signs of extreme hunger to occur before you initiate breastfeeding:   Restlessness.  A loud, strong cry.   Screaming. BREASTFEEDING BASICS Breastfeeding Initiation  Find a comfortable place to sit or lie down, with your neck and back well supported.  Place a pillow or rolled up blanket under your baby to bring him or her to the level of your breast (if you are seated). Nursing pillows are specially designed to help support your arms and your baby while you breastfeed.  Make sure that your baby's abdomen is facing your abdomen.   Gently massage your breast. With your fingertips, massage from your chest wall toward your nipple in a circular motion. This encourages milk flow. You may need to continue this action during the feeding if your milk flows slowly.  Support your breast with 4 fingers underneath and your thumb above your nipple. Make sure your fingers are well away from your nipple and your baby's mouth.   Stroke your baby's lips gently with your finger or nipple.   When your baby's mouth is open wide enough, quickly bring your baby to your breast,  placing your entire nipple and as much of the colored area around your nipple (areola) as possible into your baby's mouth.   More areola should be visible above your baby's upper lip than below the lower lip.   Your baby's tongue should be between his or her lower gum and your breast.   Ensure that your baby's mouth is correctly positioned around your nipple (latched). Your baby's lips should create a seal on your breast and be turned out (everted).  It is common for your baby to suck about 2-3 minutes in order to start the flow of breast milk. Latching Teaching your baby how to latch on to your breast   properly is very important. An improper latch can cause nipple pain and decreased milk supply for you and poor weight gain in your baby. Also, if your baby is not latched onto your nipple properly, he or she may swallow some air during feeding. This can make your baby fussy. Burping your baby when you switch breasts during the feeding can help to get rid of the air. However, teaching your baby to latch on properly is still the best way to prevent fussiness from swallowing air while breastfeeding. Signs that your baby has successfully latched on to your nipple:    Silent tugging or silent sucking, without causing you pain.   Swallowing heard between every 3-4 sucks.    Muscle movement above and in front of his or her ears while sucking.  Signs that your baby has not successfully latched on to nipple:   Sucking sounds or smacking sounds from your baby while breastfeeding.  Nipple pain. If you think your baby has not latched on correctly, slip your finger into the corner of your baby's mouth to break the suction and place it between your baby's gums. Attempt breastfeeding initiation again. Signs of Successful Breastfeeding Signs from your baby:   A gradual decrease in the number of sucks or complete cessation of sucking.   Falling asleep.   Relaxation of his or her body.    Retention of a small amount of milk in his or her mouth.   Letting go of your breast by himself or herself. Signs from you:  Breasts that have increased in firmness, weight, and size 1-3 hours after feeding.   Breasts that are softer immediately after breastfeeding.  Increased milk volume, as well as a change in milk consistency and color by the fifth day of breastfeeding.   Nipples that are not sore, cracked, or bleeding. Signs That Your Baby is Getting Enough Milk  Wetting at least 3 diapers in a 24-hour period. The urine should be clear and pale yellow by age 5 days.  At least 3 stools in a 24-hour period by age 5 days. The stool should be soft and yellow.  At least 3 stools in a 24-hour period by age 7 days. The stool should be seedy and yellow.  No loss of weight greater than 10% of birth weight during the first 3 days of age.  Average weight gain of 4-7 ounces (113-198 g) per week after age 4 days.  Consistent daily weight gain by age 5 days, without weight loss after the age of 2 weeks. After a feeding, your baby may spit up a small amount. This is common. BREASTFEEDING FREQUENCY AND DURATION Frequent feeding will help you make more milk and can prevent sore nipples and breast engorgement. Breastfeed when you feel the need to reduce the fullness of your breasts or when your baby shows signs of hunger. This is called "breastfeeding on demand." Avoid introducing a pacifier to your baby while you are working to establish breastfeeding (the first 4-6 weeks after your baby is born). After this time you may choose to use a pacifier. Research has shown that pacifier use during the first year of a baby's life decreases the risk of sudden infant death syndrome (SIDS). Allow your baby to feed on each breast as long as he or she wants. Breastfeed until your baby is finished feeding. When your baby unlatches or falls asleep while feeding from the first breast, offer the second breast.  Because newborns are often sleepy in the   first few weeks of life, you may need to awaken your baby to get him or her to feed. Breastfeeding times will vary from baby to baby. However, the following rules can serve as a guide to help you ensure that your baby is properly fed:  Newborns (babies 4 weeks of age or younger) may breastfeed every 1-3 hours.  Newborns should not go longer than 3 hours during the day or 5 hours during the night without breastfeeding.  You should breastfeed your baby a minimum of 8 times in a 24-hour period until you begin to introduce solid foods to your baby at around 6 months of age. BREAST MILK PUMPING Pumping and storing breast milk allows you to ensure that your baby is exclusively fed your breast milk, even at times when you are unable to breastfeed. This is especially important if you are going back to work while you are still breastfeeding or when you are not able to be present during feedings. Your lactation consultant can give you guidelines on how long it is safe to store breast milk.  A breast pump is a machine that allows you to pump milk from your breast into a sterile bottle. The pumped breast milk can then be stored in a refrigerator or freezer. Some breast pumps are operated by hand, while others use electricity. Ask your lactation consultant which type will work best for you. Breast pumps can be purchased, but some hospitals and breastfeeding support groups lease breast pumps on a monthly basis. A lactation consultant can teach you how to hand express breast milk, if you prefer not to use a pump.  CARING FOR YOUR BREASTS WHILE YOU BREASTFEED Nipples can become dry, cracked, and sore while breastfeeding. The following recommendations can help keep your breasts moisturized and healthy:  Avoid using soap on your nipples.   Wear a supportive bra. Although not required, special nursing bras and tank tops are designed to allow access to your breasts for  breastfeeding without taking off your entire bra or top. Avoid wearing underwire-style bras or extremely tight bras.  Air dry your nipples for 3-4minutes after each feeding.   Use only cotton bra pads to absorb leaked breast milk. Leaking of breast milk between feedings is normal.   Use lanolin on your nipples after breastfeeding. Lanolin helps to maintain your skin's normal moisture barrier. If you use pure lanolin, you do not need to wash it off before feeding your baby again. Pure lanolin is not toxic to your baby. You may also hand express a few drops of breast milk and gently massage that milk into your nipples and allow the milk to air dry. In the first few weeks after giving birth, some women experience extremely full breasts (engorgement). Engorgement can make your breasts feel heavy, warm, and tender to the touch. Engorgement peaks within 3-5 days after you give birth. The following recommendations can help ease engorgement:  Completely empty your breasts while breastfeeding or pumping. You may want to start by applying warm, moist heat (in the shower or with warm water-soaked hand towels) just before feeding or pumping. This increases circulation and helps the milk flow. If your baby does not completely empty your breasts while breastfeeding, pump any extra milk after he or she is finished.  Wear a snug bra (nursing or regular) or tank top for 1-2 days to signal your body to slightly decrease milk production.  Apply ice packs to your breasts, unless this is too uncomfortable for you.    Make sure that your baby is latched on and positioned properly while breastfeeding. If engorgement persists after 48 hours of following these recommendations, contact your health care provider or a lactation consultant. OVERALL HEALTH CARE RECOMMENDATIONS WHILE BREASTFEEDING  Eat healthy foods. Alternate between meals and snacks, eating 3 of each per day. Because what you eat affects your breast milk,  some of the foods may make your baby more irritable than usual. Avoid eating these foods if you are sure that they are negatively affecting your baby.  Drink milk, fruit juice, and water to satisfy your thirst (about 10 glasses a day).   Rest often, relax, and continue to take your prenatal vitamins to prevent fatigue, stress, and anemia.  Continue breast self-awareness checks.  Avoid chewing and smoking tobacco.  Avoid alcohol and drug use. Some medicines that may be harmful to your baby can pass through breast milk. It is important to ask your health care provider before taking any medicine, including all over-the-counter and prescription medicine as well as vitamin and herbal supplements. It is possible to become pregnant while breastfeeding. If birth control is desired, ask your health care provider about options that will be safe for your baby. SEEK MEDICAL CARE IF:   You feel like you want to stop breastfeeding or have become frustrated with breastfeeding.  You have painful breasts or nipples.  Your nipples are cracked or bleeding.  Your breasts are red, tender, or warm.  You have a swollen area on either breast.  You have a fever or chills.  You have nausea or vomiting.  You have drainage other than breast milk from your nipples.  Your breasts do not become full before feedings by the fifth day after you give birth.  You feel sad and depressed.  Your baby is too sleepy to eat well.  Your baby is having trouble sleeping.   Your baby is wetting less than 3 diapers in a 24-hour period.  Your baby has less than 3 stools in a 24-hour period.  Your baby's skin or the white part of his or her eyes becomes yellow.   Your baby is not gaining weight by 5 days of age. SEEK IMMEDIATE MEDICAL CARE IF:   Your baby is overly tired (lethargic) and does not want to wake up and feed.  Your baby develops an unexplained fever. Document Released: 11/02/2005 Document Revised:  11/07/2013 Document Reviewed: 04/26/2013 [employer] Patient Information 2015 [employer]. This information is not intended to replace advice given to you by your health care provider. Make sure you discuss any questions you have with your health care provider.  

## 2014-08-22 ENCOUNTER — Encounter: Payer: Self-pay | Admitting: Obstetrics & Gynecology

## 2014-08-22 DIAGNOSIS — O4440 Low lying placenta NOS or without hemorrhage, unspecified trimester: Secondary | ICD-10-CM | POA: Insufficient documentation

## 2014-08-22 LAB — TSH: TSH: 0.078 u[IU]/mL — AB (ref 0.350–4.500)

## 2014-08-29 ENCOUNTER — Telehealth: Payer: Self-pay | Admitting: *Deleted

## 2014-08-29 NOTE — Telephone Encounter (Signed)
Message copied by Gerri Spore on Wed Aug 29, 2014  8:15 AM ------      Message from: Donnamae Jude      Created: Wed Aug 22, 2014  8:22 AM       Her TSH is low--indicating over medication--decrease her to 1 1/2 tablets and recheck in 6 wks ------

## 2014-08-29 NOTE — Telephone Encounter (Signed)
Pt aware of results and instructions for medication.

## 2014-09-17 ENCOUNTER — Other Ambulatory Visit: Payer: Self-pay | Admitting: Family Medicine

## 2014-09-17 ENCOUNTER — Ambulatory Visit (HOSPITAL_COMMUNITY)
Admission: RE | Admit: 2014-09-17 | Discharge: 2014-09-17 | Disposition: A | Discharge status: Home / Self Care | Payer: BC Managed Care – PPO | Source: Ambulatory Visit | Attending: Family Medicine | Admitting: Family Medicine

## 2014-09-17 ENCOUNTER — Encounter: Payer: Self-pay | Admitting: Obstetrics and Gynecology

## 2014-09-17 ENCOUNTER — Ambulatory Visit (HOSPITAL_COMMUNITY): Payer: BC Managed Care – PPO

## 2014-09-17 DIAGNOSIS — Z0489 Encounter for examination and observation for other specified reasons: Secondary | ICD-10-CM | POA: Insufficient documentation

## 2014-09-17 DIAGNOSIS — Z36 Encounter for antenatal screening of mother: Secondary | ICD-10-CM | POA: Diagnosis not present

## 2014-09-17 DIAGNOSIS — O09522 Supervision of elderly multigravida, second trimester: Secondary | ICD-10-CM | POA: Insufficient documentation

## 2014-09-17 DIAGNOSIS — O4402 Placenta previa specified as without hemorrhage, second trimester: Secondary | ICD-10-CM | POA: Insufficient documentation

## 2014-09-17 DIAGNOSIS — O3421 Maternal care for scar from previous cesarean delivery: Secondary | ICD-10-CM | POA: Insufficient documentation

## 2014-09-17 DIAGNOSIS — Z3482 Encounter for supervision of other normal pregnancy, second trimester: Secondary | ICD-10-CM

## 2014-09-17 DIAGNOSIS — Z3A23 23 weeks gestation of pregnancy: Secondary | ICD-10-CM | POA: Insufficient documentation

## 2014-09-17 DIAGNOSIS — IMO0002 Reserved for concepts with insufficient information to code with codable children: Secondary | ICD-10-CM | POA: Insufficient documentation

## 2014-09-18 ENCOUNTER — Ambulatory Visit (INDEPENDENT_AMBULATORY_CARE_PROVIDER_SITE_OTHER): Payer: BC Managed Care – PPO | Admitting: Obstetrics & Gynecology

## 2014-09-18 VITALS — BP 112/65 | HR 76 | Wt 190.0 lb

## 2014-09-18 DIAGNOSIS — O34219 Maternal care for unspecified type scar from previous cesarean delivery: Secondary | ICD-10-CM | POA: Insufficient documentation

## 2014-09-18 DIAGNOSIS — O4440 Low lying placenta NOS or without hemorrhage, unspecified trimester: Secondary | ICD-10-CM

## 2014-09-18 DIAGNOSIS — O3421 Maternal care for scar from previous cesarean delivery: Secondary | ICD-10-CM

## 2014-09-18 DIAGNOSIS — Z3482 Encounter for supervision of other normal pregnancy, second trimester: Secondary | ICD-10-CM

## 2014-09-18 DIAGNOSIS — O4410 Placenta previa with hemorrhage, unspecified trimester: Secondary | ICD-10-CM

## 2014-09-18 DIAGNOSIS — O09529 Supervision of elderly multigravida, unspecified trimester: Secondary | ICD-10-CM | POA: Insufficient documentation

## 2014-09-18 DIAGNOSIS — O09522 Supervision of elderly multigravida, second trimester: Secondary | ICD-10-CM

## 2014-09-18 NOTE — Patient Instructions (Signed)
Return to clinic for any obstetric concerns or go to [facility] for evaluation  

## 2014-09-18 NOTE — Progress Notes (Signed)
09/17/14 scan showed normal anatomy, persistent low lying posterior placenta 1.3 cm from os.  Patient has history of previous cesarean section; leaning towards RCS given low lying placenta.  Will re-discuss after scan at 28 weeks. No other complaints or concerns.  Routine obstetric precautions reviewed. Bleeding and previa precautions advised.  Third trimester labs next visit.

## 2014-09-18 NOTE — Addendum Note (Signed)
Addended by: Verita Schneiders A on: 09/18/2014 04:48 PM   Modules accepted: Orders

## 2014-10-15 ENCOUNTER — Encounter: Payer: BC Managed Care – PPO | Admitting: Family Medicine

## 2014-10-15 ENCOUNTER — Encounter: Payer: Self-pay | Admitting: Family Medicine

## 2014-10-15 ENCOUNTER — Ambulatory Visit (INDEPENDENT_AMBULATORY_CARE_PROVIDER_SITE_OTHER): Payer: BC Managed Care – PPO | Admitting: Family Medicine

## 2014-10-15 VITALS — BP 115/64 | HR 84 | Wt 195.6 lb

## 2014-10-15 DIAGNOSIS — Z23 Encounter for immunization: Secondary | ICD-10-CM | POA: Diagnosis not present

## 2014-10-15 DIAGNOSIS — O360131 Maternal care for anti-D [Rh] antibodies, third trimester, fetus 1: Secondary | ICD-10-CM

## 2014-10-15 DIAGNOSIS — O26899 Other specified pregnancy related conditions, unspecified trimester: Secondary | ICD-10-CM

## 2014-10-15 DIAGNOSIS — O34219 Maternal care for unspecified type scar from previous cesarean delivery: Secondary | ICD-10-CM

## 2014-10-15 DIAGNOSIS — O3421 Maternal care for scar from previous cesarean delivery: Secondary | ICD-10-CM

## 2014-10-15 DIAGNOSIS — Z6791 Unspecified blood type, Rh negative: Secondary | ICD-10-CM | POA: Insufficient documentation

## 2014-10-15 DIAGNOSIS — Z3483 Encounter for supervision of other normal pregnancy, third trimester: Secondary | ICD-10-CM | POA: Diagnosis not present

## 2014-10-15 DIAGNOSIS — Z3482 Encounter for supervision of other normal pregnancy, second trimester: Secondary | ICD-10-CM

## 2014-10-15 LAB — CBC WITH DIFFERENTIAL/PLATELET
BASOS PCT: 0 % (ref 0–1)
Basophils Absolute: 0 10*3/uL (ref 0.0–0.1)
Eosinophils Absolute: 0.2 10*3/uL (ref 0.0–0.7)
Eosinophils Relative: 2 % (ref 0–5)
HCT: 35.9 % — ABNORMAL LOW (ref 36.0–46.0)
HEMOGLOBIN: 12.3 g/dL (ref 12.0–15.0)
Lymphocytes Relative: 24 % (ref 12–46)
Lymphs Abs: 1.8 10*3/uL (ref 0.7–4.0)
MCH: 31.5 pg (ref 26.0–34.0)
MCHC: 34.3 g/dL (ref 30.0–36.0)
MCV: 92.1 fL (ref 78.0–100.0)
MONOS PCT: 5 % (ref 3–12)
MPV: 10.1 fL (ref 9.4–12.4)
Monocytes Absolute: 0.4 10*3/uL (ref 0.1–1.0)
NEUTROS ABS: 5.2 10*3/uL (ref 1.7–7.7)
Neutrophils Relative %: 69 % (ref 43–77)
PLATELETS: 282 10*3/uL (ref 150–400)
RBC: 3.9 MIL/uL (ref 3.87–5.11)
RDW: 13.8 % (ref 11.5–15.5)
WBC: 7.5 10*3/uL (ref 4.0–10.5)

## 2014-10-15 LAB — TSH: TSH: 1.462 u[IU]/mL (ref 0.350–4.500)

## 2014-10-15 MED ORDER — RHO D IMMUNE GLOBULIN 1500 UNIT/2ML IJ SOSY
300.0000 ug | PREFILLED_SYRINGE | Freq: Once | INTRAMUSCULAR | Status: AC
  Administered 2014-10-15: 300 ug via INTRAMUSCULAR

## 2014-10-15 MED ORDER — TETANUS-DIPHTH-ACELL PERTUSSIS 5-2.5-18.5 LF-MCG/0.5 IM SUSP
0.5000 mL | Freq: Once | INTRAMUSCULAR | Status: AC
  Administered 2014-10-15: 0.5 mL via INTRAMUSCULAR

## 2014-10-15 NOTE — Patient Instructions (Signed)
Third Trimester of Pregnancy The third trimester is from week 29 through week 42, months 7 through 9. The third trimester is a time when the fetus is growing rapidly. At the end of the ninth month, the fetus is about 20 inches in length and weighs 6-10 pounds.  BODY CHANGES Your body goes through many changes during pregnancy. The changes vary from woman to woman.   Your weight will continue to increase. You can expect to gain 25-35 pounds (11-16 kg) by the end of the pregnancy.  You may begin to get stretch marks on your hips, abdomen, and breasts.  You may urinate more often because the fetus is moving lower into your pelvis and pressing on your bladder.  You may develop or continue to have heartburn as a result of your pregnancy.  You may develop constipation because certain hormones are causing the muscles that push waste through your intestines to slow down.  You may develop hemorrhoids or swollen, bulging veins (varicose veins).  You may have pelvic pain because of the weight gain and pregnancy hormones relaxing your joints between the bones in your pelvis. Backaches may result from overexertion of the muscles supporting your posture.  You may have changes in your hair. These can include thickening of your hair, rapid growth, and changes in texture. Some women also have hair loss during or after pregnancy, or hair that feels dry or thin. Your hair will most likely return to normal after your baby is born.  Your breasts will continue to grow and be tender. A yellow discharge may leak from your breasts called colostrum.  Your belly button may stick out.  You may feel short of breath because of your expanding uterus.  You may notice the fetus "dropping," or moving lower in your abdomen.  You may have a bloody mucus discharge. This usually occurs a few days to a week before labor begins.  Your cervix becomes thin and soft (effaced) near your due date. WHAT TO EXPECT AT YOUR  PRENATAL EXAMS  You will have prenatal exams every 2 weeks until week 36. Then, you will have weekly prenatal exams. During a routine prenatal visit:  You will be weighed to make sure you and the fetus are growing normally.  Your blood pressure is taken.  Your abdomen will be measured to track your baby's growth.  The fetal heartbeat will be listened to.  Any test results from the previous visit will be discussed.  You may have a cervical check near your due date to see if you have effaced. At around 36 weeks, your caregiver will check your cervix. At the same time, your caregiver will also perform a test on the secretions of the vaginal tissue. This test is to determine if a type of bacteria, Group B streptococcus, is present. Your caregiver will explain this further. Your caregiver may ask you:  What your birth plan is.  How you are feeling.  If you are feeling the baby move.  If you have had any abnormal symptoms, such as leaking fluid, bleeding, severe headaches, or abdominal cramping.  If you have any questions. Other tests or screenings that may be performed during your third trimester include:  Blood tests that check for low iron levels (anemia).  Fetal testing to check the health, activity level, and growth of the fetus. Testing is done if you have certain medical conditions or if there are problems during the pregnancy. FALSE LABOR You may feel small, irregular contractions that   eventually go away. These are called Braxton Hicks contractions, or false labor. Contractions may last for hours, days, or even weeks before true labor sets in. If contractions come at regular intervals, intensify, or become painful, it is best to be seen by your caregiver.  SIGNS OF LABOR   Menstrual-like cramps.  Contractions that are 5 minutes apart or less.  Contractions that start on the top of the uterus and spread down to the lower abdomen and back.  A sense of increased pelvic  pressure or back pain.  A watery or bloody mucus discharge that comes from the vagina. If you have any of these signs before the 37th week of pregnancy, call your caregiver right away. You need to go to the hospital to get checked immediately. HOME CARE INSTRUCTIONS   Avoid all smoking, herbs, alcohol, and unprescribed drugs. These chemicals affect the formation and growth of the baby.  Follow your caregiver's instructions regarding medicine use. There are medicines that are either safe or unsafe to take during pregnancy.  Exercise only as directed by your caregiver. Experiencing uterine cramps is a good sign to stop exercising.  Continue to eat regular, healthy meals.  Wear a good support bra for breast tenderness.  Do not use hot tubs, steam rooms, or saunas.  Wear your seat belt at all times when driving.  Avoid raw meat, uncooked cheese, cat litter boxes, and soil used by cats. These carry germs that can cause birth defects in the baby.  Take your prenatal vitamins.  Try taking a stool softener (if your caregiver approves) if you develop constipation. Eat more high-fiber foods, such as fresh vegetables or fruit and whole grains. Drink plenty of fluids to keep your urine clear or pale yellow.  Take warm sitz baths to soothe any pain or discomfort caused by hemorrhoids. Use hemorrhoid cream if your caregiver approves.  If you develop varicose veins, wear support hose. Elevate your feet for 15 minutes, 3-4 times a day. Limit salt in your diet.  Avoid heavy lifting, wear low heal shoes, and practice good posture.  Rest a lot with your legs elevated if you have leg cramps or low back pain.  Visit your dentist if you have not gone during your pregnancy. Use a soft toothbrush to brush your teeth and be gentle when you floss.  A sexual relationship may be continued unless your caregiver directs you otherwise.  Do not travel far distances unless it is absolutely necessary and only  with the approval of your caregiver.  Take prenatal classes to understand, practice, and ask questions about the labor and delivery.  Make a trial run to the hospital.  Pack your hospital bag.  Prepare the baby's nursery.  Continue to go to all your prenatal visits as directed by your caregiver. SEEK MEDICAL CARE IF:  You are unsure if you are in labor or if your water has broken.  You have dizziness.  You have mild pelvic cramps, pelvic pressure, or nagging pain in your abdominal area.  You have persistent nausea, vomiting, or diarrhea.  You have a bad smelling vaginal discharge.  You have pain with urination. SEEK IMMEDIATE MEDICAL CARE IF:   You have a fever.  You are leaking fluid from your vagina.  You have spotting or bleeding from your vagina.  You have severe abdominal cramping or pain.  You have rapid weight loss or gain.  You have shortness of breath with chest pain.  You notice sudden or extreme swelling   of your face, hands, ankles, feet, or legs.  You have not felt your baby move in over an hour.  You have severe headaches that do not go away with medicine.  You have vision changes. Document Released: 10/27/2001 Document Revised: 11/07/2013 Document Reviewed: 01/03/2013 [employer] Patient Information 2015 [employer]. This information is not intended to replace advice given to you by your health care provider. Make sure you discuss any questions you have with your health care provider.  Breastfeeding Deciding to breastfeed is one of the best choices you can make for you and your baby. A change in hormones during pregnancy causes your breast tissue to grow and increases the number and size of your milk ducts. These hormones also allow proteins, sugars, and fats from your blood supply to make breast milk in your milk-producing glands. Hormones prevent breast milk from being released before your baby is born as well as prompt milk flow after birth. Once  breastfeeding has begun, thoughts of your baby, as well as his or her sucking or crying, can stimulate the release of milk from your milk-producing glands.  BENEFITS OF BREASTFEEDING For Your Baby  Your first milk (colostrum) helps your baby's digestive system function better.   There are antibodies in your milk that help your baby fight off infections.   Your baby has a lower incidence of asthma, allergies, and sudden infant death syndrome.   The nutrients in breast milk are better for your baby than infant formulas and are designed uniquely for your baby's needs.   Breast milk improves your baby's brain development.   Your baby is less likely to develop other conditions, such as childhood obesity, asthma, or type 2 diabetes mellitus.  For You   Breastfeeding helps to create a very special bond between you and your baby.   Breastfeeding is convenient. Breast milk is always available at the correct temperature and costs nothing.   Breastfeeding helps to burn calories and helps you lose the weight gained during pregnancy.   Breastfeeding makes your uterus contract to its prepregnancy size faster and slows bleeding (lochia) after you give birth.   Breastfeeding helps to lower your risk of developing type 2 diabetes mellitus, osteoporosis, and breast or ovarian cancer later in life. SIGNS THAT YOUR BABY IS HUNGRY Early Signs of Hunger  Increased alertness or activity.  Stretching.  Movement of the head from side to side.  Movement of the head and opening of the mouth when the corner of the mouth or cheek is stroked (rooting).  Increased sucking sounds, smacking lips, cooing, sighing, or squeaking.  Hand-to-mouth movements.  Increased sucking of fingers or hands. Late Signs of Hunger  Fussing.  Intermittent crying. Extreme Signs of Hunger Signs of extreme hunger will require calming and consoling before your baby will be able to breastfeed successfully. Do not  wait for the following signs of extreme hunger to occur before you initiate breastfeeding:   Restlessness.  A loud, strong cry.   Screaming. BREASTFEEDING BASICS Breastfeeding Initiation  Find a comfortable place to sit or lie down, with your neck and back well supported.  Place a pillow or rolled up blanket under your baby to bring him or her to the level of your breast (if you are seated). Nursing pillows are specially designed to help support your arms and your baby while you breastfeed.  Make sure that your baby's abdomen is facing your abdomen.   Gently massage your breast. With your fingertips, massage from your chest   wall toward your nipple in a circular motion. This encourages milk flow. You may need to continue this action during the feeding if your milk flows slowly.  Support your breast with 4 fingers underneath and your thumb above your nipple. Make sure your fingers are well away from your nipple and your baby's mouth.   Stroke your baby's lips gently with your finger or nipple.   When your baby's mouth is open wide enough, quickly bring your baby to your breast, placing your entire nipple and as much of the colored area around your nipple (areola) as possible into your baby's mouth.   More areola should be visible above your baby's upper lip than below the lower lip.   Your baby's tongue should be between his or her lower gum and your breast.   Ensure that your baby's mouth is correctly positioned around your nipple (latched). Your baby's lips should create a seal on your breast and be turned out (everted).  It is common for your baby to suck about 2-3 minutes in order to start the flow of breast milk. Latching Teaching your baby how to latch on to your breast properly is very important. An improper latch can cause nipple pain and decreased milk supply for you and poor weight gain in your baby. Also, if your baby is not latched onto your nipple properly, he or she  may swallow some air during feeding. This can make your baby fussy. Burping your baby when you switch breasts during the feeding can help to get rid of the air. However, teaching your baby to latch on properly is still the best way to prevent fussiness from swallowing air while breastfeeding. Signs that your baby has successfully latched on to your nipple:    Silent tugging or silent sucking, without causing you pain.   Swallowing heard between every 3-4 sucks.    Muscle movement above and in front of his or her ears while sucking.  Signs that your baby has not successfully latched on to nipple:   Sucking sounds or smacking sounds from your baby while breastfeeding.  Nipple pain. If you think your baby has not latched on correctly, slip your finger into the corner of your baby's mouth to break the suction and place it between your baby's gums. Attempt breastfeeding initiation again. Signs of Successful Breastfeeding Signs from your baby:   A gradual decrease in the number of sucks or complete cessation of sucking.   Falling asleep.   Relaxation of his or her body.   Retention of a small amount of milk in his or her mouth.   Letting go of your breast by himself or herself. Signs from you:  Breasts that have increased in firmness, weight, and size 1-3 hours after feeding.   Breasts that are softer immediately after breastfeeding.  Increased milk volume, as well as a change in milk consistency and color by the fifth day of breastfeeding.   Nipples that are not sore, cracked, or bleeding. Signs That Your Baby is Getting Enough Milk  Wetting at least 3 diapers in a 24-hour period. The urine should be clear and pale yellow by age 5 days.  At least 3 stools in a 24-hour period by age 5 days. The stool should be soft and yellow.  At least 3 stools in a 24-hour period by age 7 days. The stool should be seedy and yellow.  No loss of weight greater than 10% of birth weight  during the first 3   days of age.  Average weight gain of 4-7 ounces (113-198 g) per week after age 4 days.  Consistent daily weight gain by age 5 days, without weight loss after the age of 2 weeks. After a feeding, your baby may spit up a small amount. This is common. BREASTFEEDING FREQUENCY AND DURATION Frequent feeding will help you make more milk and can prevent sore nipples and breast engorgement. Breastfeed when you feel the need to reduce the fullness of your breasts or when your baby shows signs of hunger. This is called "breastfeeding on demand." Avoid introducing a pacifier to your baby while you are working to establish breastfeeding (the first 4-6 weeks after your baby is born). After this time you may choose to use a pacifier. Research has shown that pacifier use during the first year of a baby's life decreases the risk of sudden infant death syndrome (SIDS). Allow your baby to feed on each breast as long as he or she wants. Breastfeed until your baby is finished feeding. When your baby unlatches or falls asleep while feeding from the first breast, offer the second breast. Because newborns are often sleepy in the first few weeks of life, you may need to awaken your baby to get him or her to feed. Breastfeeding times will vary from baby to baby. However, the following rules can serve as a guide to help you ensure that your baby is properly fed:  Newborns (babies 4 weeks of age or younger) may breastfeed every 1-3 hours.  Newborns should not go longer than 3 hours during the day or 5 hours during the night without breastfeeding.  You should breastfeed your baby a minimum of 8 times in a 24-hour period until you begin to introduce solid foods to your baby at around 6 months of age. BREAST MILK PUMPING Pumping and storing breast milk allows you to ensure that your baby is exclusively fed your breast milk, even at times when you are unable to breastfeed. This is especially important if you are  going back to work while you are still breastfeeding or when you are not able to be present during feedings. Your lactation consultant can give you guidelines on how long it is safe to store breast milk.  A breast pump is a machine that allows you to pump milk from your breast into a sterile bottle. The pumped breast milk can then be stored in a refrigerator or freezer. Some breast pumps are operated by hand, while others use electricity. Ask your lactation consultant which type will work best for you. Breast pumps can be purchased, but some hospitals and breastfeeding support groups lease breast pumps on a monthly basis. A lactation consultant can teach you how to hand express breast milk, if you prefer not to use a pump.  CARING FOR YOUR BREASTS WHILE YOU BREASTFEED Nipples can become dry, cracked, and sore while breastfeeding. The following recommendations can help keep your breasts moisturized and healthy:  Avoid using soap on your nipples.   Wear a supportive bra. Although not required, special nursing bras and tank tops are designed to allow access to your breasts for breastfeeding without taking off your entire bra or top. Avoid wearing underwire-style bras or extremely tight bras.  Air dry your nipples for 3-4minutes after each feeding.   Use only cotton bra pads to absorb leaked breast milk. Leaking of breast milk between feedings is normal.   Use lanolin on your nipples after breastfeeding. Lanolin helps to maintain your skin's   normal moisture barrier. If you use pure lanolin, you do not need to wash it off before feeding your baby again. Pure lanolin is not toxic to your baby. You may also hand express a few drops of breast milk and gently massage that milk into your nipples and allow the milk to air dry. In the first few weeks after giving birth, some women experience extremely full breasts (engorgement). Engorgement can make your breasts feel heavy, warm, and tender to the touch.  Engorgement peaks within 3-5 days after you give birth. The following recommendations can help ease engorgement:  Completely empty your breasts while breastfeeding or pumping. You may want to start by applying warm, moist heat (in the shower or with warm water-soaked hand towels) just before feeding or pumping. This increases circulation and helps the milk flow. If your baby does not completely empty your breasts while breastfeeding, pump any extra milk after he or she is finished.  Wear a snug bra (nursing or regular) or tank top for 1-2 days to signal your body to slightly decrease milk production.  Apply ice packs to your breasts, unless this is too uncomfortable for you.  Make sure that your baby is latched on and positioned properly while breastfeeding. If engorgement persists after 48 hours of following these recommendations, contact your health care provider or a lactation consultant. OVERALL HEALTH CARE RECOMMENDATIONS WHILE BREASTFEEDING  Eat healthy foods. Alternate between meals and snacks, eating 3 of each per day. Because what you eat affects your breast milk, some of the foods may make your baby more irritable than usual. Avoid eating these foods if you are sure that they are negatively affecting your baby.  Drink milk, fruit juice, and water to satisfy your thirst (about 10 glasses a day).   Rest often, relax, and continue to take your prenatal vitamins to prevent fatigue, stress, and anemia.  Continue breast self-awareness checks.  Avoid chewing and smoking tobacco.  Avoid alcohol and drug use. Some medicines that may be harmful to your baby can pass through breast milk. It is important to ask your health care provider before taking any medicine, including all over-the-counter and prescription medicine as well as vitamin and herbal supplements. It is possible to become pregnant while breastfeeding. If birth control is desired, ask your health care provider about options that  will be safe for your baby. SEEK MEDICAL CARE IF:   You feel like you want to stop breastfeeding or have become frustrated with breastfeeding.  You have painful breasts or nipples.  Your nipples are cracked or bleeding.  Your breasts are red, tender, or warm.  You have a swollen area on either breast.  You have a fever or chills.  You have nausea or vomiting.  You have drainage other than breast milk from your nipples.  Your breasts do not become full before feedings by the fifth day after you give birth.  You feel sad and depressed.  Your baby is too sleepy to eat well.  Your baby is having trouble sleeping.   Your baby is wetting less than 3 diapers in a 24-hour period.  Your baby has less than 3 stools in a 24-hour period.  Your baby's skin or the white part of his or her eyes becomes yellow.   Your baby is not gaining weight by 5 days of age. SEEK IMMEDIATE MEDICAL CARE IF:   Your baby is overly tired (lethargic) and does not want to wake up and feed.  Your baby   develops an unexplained fever. Document Released: 11/02/2005 Document Revised: 11/07/2013 Document Reviewed: 04/26/2013 [employer] Patient Information 2015 [employer]. This information is not intended to replace advice given to you by your health care provider. Make sure you discuss any questions you have with your health care provider.  

## 2014-10-15 NOTE — Progress Notes (Signed)
28 wk labs today TDaP today Needs TSH  Rhogam today

## 2014-10-15 NOTE — Progress Notes (Signed)
Glucola today.

## 2014-10-16 LAB — GLUCOSE TOLERANCE, 1 HOUR (50G) W/O FASTING: GLUCOSE 1 HOUR GTT: 133 mg/dL (ref 70–140)

## 2014-10-16 LAB — RPR

## 2014-10-16 LAB — HIV ANTIBODY (ROUTINE TESTING W REFLEX): HIV: NONREACTIVE

## 2014-10-16 LAB — ANTIBODY SCREEN: Antibody Screen: NEGATIVE

## 2014-10-23 ENCOUNTER — Other Ambulatory Visit: Payer: Self-pay | Admitting: Obstetrics & Gynecology

## 2014-10-23 ENCOUNTER — Encounter (HOSPITAL_COMMUNITY): Payer: Self-pay

## 2014-10-23 ENCOUNTER — Ambulatory Visit (HOSPITAL_COMMUNITY)
Admission: RE | Admit: 2014-10-23 | Discharge: 2014-10-23 | Disposition: A | Discharge status: Home / Self Care | Payer: BC Managed Care – PPO | Source: Ambulatory Visit | Attending: Obstetrics & Gynecology | Admitting: Obstetrics & Gynecology

## 2014-10-23 DIAGNOSIS — O34219 Maternal care for unspecified type scar from previous cesarean delivery: Secondary | ICD-10-CM

## 2014-10-23 DIAGNOSIS — Z3A33 33 weeks gestation of pregnancy: Secondary | ICD-10-CM

## 2014-10-23 DIAGNOSIS — O4440 Low lying placenta NOS or without hemorrhage, unspecified trimester: Secondary | ICD-10-CM

## 2014-10-23 DIAGNOSIS — O09529 Supervision of elderly multigravida, unspecified trimester: Secondary | ICD-10-CM | POA: Insufficient documentation

## 2014-10-23 DIAGNOSIS — O4402 Placenta previa specified as without hemorrhage, second trimester: Secondary | ICD-10-CM

## 2014-10-23 DIAGNOSIS — Z3A28 28 weeks gestation of pregnancy: Secondary | ICD-10-CM | POA: Diagnosis not present

## 2014-10-23 DIAGNOSIS — O09523 Supervision of elderly multigravida, third trimester: Secondary | ICD-10-CM

## 2014-10-23 DIAGNOSIS — O4403 Placenta previa specified as without hemorrhage, third trimester: Secondary | ICD-10-CM | POA: Diagnosis present

## 2014-11-01 ENCOUNTER — Encounter: Payer: Self-pay | Admitting: Obstetrics & Gynecology

## 2014-11-01 ENCOUNTER — Ambulatory Visit (INDEPENDENT_AMBULATORY_CARE_PROVIDER_SITE_OTHER): Payer: BC Managed Care – PPO | Admitting: Obstetrics & Gynecology

## 2014-11-01 VITALS — BP 118/86 | HR 109 | Wt 197.0 lb

## 2014-11-01 DIAGNOSIS — Z3482 Encounter for supervision of other normal pregnancy, second trimester: Secondary | ICD-10-CM

## 2014-11-01 NOTE — Progress Notes (Signed)
Routine visit. Good FM. No problems. We discussed the pros and cons of TOLAC versus RLTCS.

## 2014-11-14 ENCOUNTER — Ambulatory Visit (INDEPENDENT_AMBULATORY_CARE_PROVIDER_SITE_OTHER): Payer: BC Managed Care – PPO | Admitting: Obstetrics & Gynecology

## 2014-11-14 ENCOUNTER — Encounter: Payer: Self-pay | Admitting: Obstetrics & Gynecology

## 2014-11-14 VITALS — BP 122/87 | HR 103 | Wt 203.0 lb

## 2014-11-14 DIAGNOSIS — Z3483 Encounter for supervision of other normal pregnancy, third trimester: Secondary | ICD-10-CM

## 2014-11-14 NOTE — Progress Notes (Signed)
Pt c/o bleeding henmorrhoid and joint pain occ ctx, No LOF or VB. +FM

## 2014-11-14 NOTE — Patient Instructions (Signed)
Third Trimester of Pregnancy The third trimester is from week 29 through week 42, months 7 through 9. The third trimester is a time when the fetus is growing rapidly. At the end of the ninth month, the fetus is about 20 inches in length and weighs 6-10 pounds.  BODY CHANGES Your body goes through many changes during pregnancy. The changes vary from woman to woman.   Your weight will continue to increase. You can expect to gain 25-35 pounds (11-16 kg) by the end of the pregnancy.  You may begin to get stretch marks on your hips, abdomen, and breasts.  You may urinate more often because the fetus is moving lower into your pelvis and pressing on your bladder.  You may develop or continue to have heartburn as a result of your pregnancy.  You may develop constipation because certain hormones are causing the muscles that push waste through your intestines to slow down.  You may develop hemorrhoids or swollen, bulging veins (varicose veins).  You may have pelvic pain because of the weight gain and pregnancy hormones relaxing your joints between the bones in your pelvis. Backaches may result from overexertion of the muscles supporting your posture.  You may have changes in your hair. These can include thickening of your hair, rapid growth, and changes in texture. Some women also have hair loss during or after pregnancy, or hair that feels dry or thin. Your hair will most likely return to normal after your baby is born.  Your breasts will continue to grow and be tender. A yellow discharge may leak from your breasts called colostrum.  Your belly button may stick out.  You may feel short of breath because of your expanding uterus.  You may notice the fetus "dropping," or moving lower in your abdomen.  You may have a bloody mucus discharge. This usually occurs a few days to a week before labor begins.  Your cervix becomes thin and soft (effaced) near your due date. WHAT TO EXPECT AT YOUR PRENATAL  EXAMS  You will have prenatal exams every 2 weeks until week 36. Then, you will have weekly prenatal exams. During a routine prenatal visit:  You will be weighed to make sure you and the fetus are growing normally.  Your blood pressure is taken.  Your abdomen will be measured to track your baby's growth.  The fetal heartbeat will be listened to.  Any test results from the previous visit will be discussed.  You may have a cervical check near your due date to see if you have effaced. At around 36 weeks, your caregiver will check your cervix. At the same time, your caregiver will also perform a test on the secretions of the vaginal tissue. This test is to determine if a type of bacteria, Group B streptococcus, is present. Your caregiver will explain this further. Your caregiver may ask you:  What your birth plan is.  How you are feeling.  If you are feeling the baby move.  If you have had any abnormal symptoms, such as leaking fluid, bleeding, severe headaches, or abdominal cramping.  If you have any questions. Other tests or screenings that may be performed during your third trimester include:  Blood tests that check for low iron levels (anemia).  Fetal testing to check the health, activity level, and growth of the fetus. Testing is done if you have certain medical conditions or if there are problems during the pregnancy. FALSE LABOR You may feel small, irregular contractions that   eventually go away. These are called Braxton Hicks contractions, or false labor. Contractions may last for hours, days, or even weeks before true labor sets in. If contractions come at regular intervals, intensify, or become painful, it is best to be seen by your caregiver.  SIGNS OF LABOR   Menstrual-like cramps.  Contractions that are 5 minutes apart or less.  Contractions that start on the top of the uterus and spread down to the lower abdomen and back.  A sense of increased pelvic pressure or back  pain.  A watery or bloody mucus discharge that comes from the vagina. If you have any of these signs before the 37th week of pregnancy, call your caregiver right away. You need to go to the hospital to get checked immediately. HOME CARE INSTRUCTIONS   Avoid all smoking, herbs, alcohol, and unprescribed drugs. These chemicals affect the formation and growth of the baby.  Follow your caregiver's instructions regarding medicine use. There are medicines that are either safe or unsafe to take during pregnancy.  Exercise only as directed by your caregiver. Experiencing uterine cramps is a good sign to stop exercising.  Continue to eat regular, healthy meals.  Wear a good support bra for breast tenderness.  Do not use hot tubs, steam rooms, or saunas.  Wear your seat belt at all times when driving.  Avoid raw meat, uncooked cheese, cat litter boxes, and soil used by cats. These carry germs that can cause birth defects in the baby.  Take your prenatal vitamins.  Try taking a stool softener (if your caregiver approves) if you develop constipation. Eat more high-fiber foods, such as fresh vegetables or fruit and whole grains. Drink plenty of fluids to keep your urine clear or pale yellow.  Take warm sitz baths to soothe any pain or discomfort caused by hemorrhoids. Use hemorrhoid cream if your caregiver approves.  If you develop varicose veins, wear support hose. Elevate your feet for 15 minutes, 3-4 times a day. Limit salt in your diet.  Avoid heavy lifting, wear low heal shoes, and practice good posture.  Rest a lot with your legs elevated if you have leg cramps or low back pain.  Visit your dentist if you have not gone during your pregnancy. Use a soft toothbrush to brush your teeth and be gentle when you floss.  A sexual relationship may be continued unless your caregiver directs you otherwise.  Do not travel far distances unless it is absolutely necessary and only with the approval  of your caregiver.  Take prenatal classes to understand, practice, and ask questions about the labor and delivery.  Make a trial run to the hospital.  Pack your hospital bag.  Prepare the baby's nursery.  Continue to go to all your prenatal visits as directed by your caregiver. SEEK MEDICAL CARE IF:  You are unsure if you are in labor or if your water has broken.  You have dizziness.  You have mild pelvic cramps, pelvic pressure, or nagging pain in your abdominal area.  You have persistent nausea, vomiting, or diarrhea.  You have a bad smelling vaginal discharge.  You have pain with urination. SEEK IMMEDIATE MEDICAL CARE IF:   You have a fever.  You are leaking fluid from your vagina.  You have spotting or bleeding from your vagina.  You have severe abdominal cramping or pain.  You have rapid weight loss or gain.  You have shortness of breath with chest pain.  You notice sudden or extreme swelling   of your face, hands, ankles, feet, or legs.  You have not felt your baby move in over an hour.  You have severe headaches that do not go away with medicine.  You have vision changes. Document Released: 10/27/2001 Document Revised: 11/07/2013 Document Reviewed: 01/03/2013 [employer] Patient Information 2015 [employer]. This information is not intended to replace advice given to you by your health care provider. Make sure you discuss any questions you have with your health care provider.  

## 2014-11-16 NOTE — L&D Delivery Note (Signed)
Delivery Note At 3:02 PM a viable and healthy female was delivered via Vaginal, Spontaneous Delivery (Presentation: Left Occiput Anterior).  APGAR: 8, 9; weight  .   Placenta status: Spontaneous, Abnormal, 20% abruption,sent to pathology.  Cord: 3 vessels with the following complications: loose nuchal.    Anesthesia: Epidural  Episiotomy: None Lacerations: 3rd degree;Sulcus;Perineal Suture Repair: 3.0 vicryl Est. Blood Loss (mL):  350  Mom to postpartum.  Baby to Couplet care / Skin to Skin.  Delivery supervised by Derrill Memo, CNM & lacerations repaired by Dr. Elly Modena.  Gaspar Garbe, SNM 01/04/2015, 4:03 PM

## 2014-11-27 ENCOUNTER — Other Ambulatory Visit: Payer: Self-pay | Admitting: Obstetrics & Gynecology

## 2014-11-27 ENCOUNTER — Ambulatory Visit (HOSPITAL_COMMUNITY)
Admission: RE | Admit: 2014-11-27 | Discharge: 2014-11-27 | Disposition: A | Discharge status: Home / Self Care | Payer: BLUE CROSS/BLUE SHIELD | Source: Ambulatory Visit | Attending: Obstetrics & Gynecology | Admitting: Obstetrics & Gynecology

## 2014-11-27 DIAGNOSIS — O360930 Maternal care for other rhesus isoimmunization, third trimester, not applicable or unspecified: Secondary | ICD-10-CM | POA: Diagnosis not present

## 2014-11-27 DIAGNOSIS — O4440 Low lying placenta NOS or without hemorrhage, unspecified trimester: Secondary | ICD-10-CM

## 2014-11-27 DIAGNOSIS — O09523 Supervision of elderly multigravida, third trimester: Secondary | ICD-10-CM | POA: Diagnosis not present

## 2014-11-27 DIAGNOSIS — O4402 Placenta previa specified as without hemorrhage, second trimester: Secondary | ICD-10-CM

## 2014-11-27 DIAGNOSIS — Z3A33 33 weeks gestation of pregnancy: Secondary | ICD-10-CM

## 2014-11-27 DIAGNOSIS — O34219 Maternal care for unspecified type scar from previous cesarean delivery: Secondary | ICD-10-CM

## 2014-11-27 DIAGNOSIS — O99283 Endocrine, nutritional and metabolic diseases complicating pregnancy, third trimester: Secondary | ICD-10-CM | POA: Diagnosis present

## 2014-11-27 DIAGNOSIS — E039 Hypothyroidism, unspecified: Secondary | ICD-10-CM | POA: Insufficient documentation

## 2014-11-28 ENCOUNTER — Ambulatory Visit (INDEPENDENT_AMBULATORY_CARE_PROVIDER_SITE_OTHER): Payer: BLUE CROSS/BLUE SHIELD | Admitting: Advanced Practice Midwife

## 2014-11-28 VITALS — BP 113/81 | HR 95 | Wt 207.0 lb

## 2014-11-28 DIAGNOSIS — Z3483 Encounter for supervision of other normal pregnancy, third trimester: Secondary | ICD-10-CM

## 2014-11-28 NOTE — Progress Notes (Signed)
Had [place] yesterday. Good growth at 39%.  They recommend f/u growth 4-5 wks. Has some reflux at night only , Recommend zantac prn.

## 2014-11-28 NOTE — Patient Instructions (Signed)
Third Trimester of Pregnancy The third trimester is from week 29 through week 42, months 7 through 9. The third trimester is a time when the fetus is growing rapidly. At the end of the ninth month, the fetus is about 20 inches in length and weighs 6-10 pounds.  BODY CHANGES Your body goes through many changes during pregnancy. The changes vary from woman to woman.   Your weight will continue to increase. You can expect to gain 25-35 pounds (11-16 kg) by the end of the pregnancy.  You may begin to get stretch marks on your hips, abdomen, and breasts.  You may urinate more often because the fetus is moving lower into your pelvis and pressing on your bladder.  You may develop or continue to have heartburn as a result of your pregnancy.  You may develop constipation because certain hormones are causing the muscles that push waste through your intestines to slow down.  You may develop hemorrhoids or swollen, bulging veins (varicose veins).  You may have pelvic pain because of the weight gain and pregnancy hormones relaxing your joints between the bones in your pelvis. Backaches may result from overexertion of the muscles supporting your posture.  You may have changes in your hair. These can include thickening of your hair, rapid growth, and changes in texture. Some women also have hair loss during or after pregnancy, or hair that feels dry or thin. Your hair will most likely return to normal after your baby is born.  Your breasts will continue to grow and be tender. A yellow discharge may leak from your breasts called colostrum.  Your belly button may stick out.  You may feel short of breath because of your expanding uterus.  You may notice the fetus "dropping," or moving lower in your abdomen.  You may have a bloody mucus discharge. This usually occurs a few days to a week before labor begins.  Your cervix becomes thin and soft (effaced) near your due date. WHAT TO EXPECT AT YOUR PRENATAL  EXAMS  You will have prenatal exams every 2 weeks until week 36. Then, you will have weekly prenatal exams. During a routine prenatal visit:  You will be weighed to make sure you and the fetus are growing normally.  Your blood pressure is taken.  Your abdomen will be measured to track your baby's growth.  The fetal heartbeat will be listened to.  Any test results from the previous visit will be discussed.  You may have a cervical check near your due date to see if you have effaced. At around 36 weeks, your caregiver will check your cervix. At the same time, your caregiver will also perform a test on the secretions of the vaginal tissue. This test is to determine if a type of bacteria, Group B streptococcus, is present. Your caregiver will explain this further. Your caregiver may ask you:  What your birth plan is.  How you are feeling.  If you are feeling the baby move.  If you have had any abnormal symptoms, such as leaking fluid, bleeding, severe headaches, or abdominal cramping.  If you have any questions. Other tests or screenings that may be performed during your third trimester include:  Blood tests that check for low iron levels (anemia).  Fetal testing to check the health, activity level, and growth of the fetus. Testing is done if you have certain medical conditions or if there are problems during the pregnancy. FALSE LABOR You may feel small, irregular contractions that   eventually go away. These are called Braxton Hicks contractions, or false labor. Contractions may last for hours, days, or even weeks before true labor sets in. If contractions come at regular intervals, intensify, or become painful, it is best to be seen by your caregiver.  SIGNS OF LABOR   Menstrual-like cramps.  Contractions that are 5 minutes apart or less.  Contractions that start on the top of the uterus and spread down to the lower abdomen and back.  A sense of increased pelvic pressure or back  pain.  A watery or bloody mucus discharge that comes from the vagina. If you have any of these signs before the 37th week of pregnancy, call your caregiver right away. You need to go to the hospital to get checked immediately. HOME CARE INSTRUCTIONS   Avoid all smoking, herbs, alcohol, and unprescribed drugs. These chemicals affect the formation and growth of the baby.  Follow your caregiver's instructions regarding medicine use. There are medicines that are either safe or unsafe to take during pregnancy.  Exercise only as directed by your caregiver. Experiencing uterine cramps is a good sign to stop exercising.  Continue to eat regular, healthy meals.  Wear a good support bra for breast tenderness.  Do not use hot tubs, steam rooms, or saunas.  Wear your seat belt at all times when driving.  Avoid raw meat, uncooked cheese, cat litter boxes, and soil used by cats. These carry germs that can cause birth defects in the baby.  Take your prenatal vitamins.  Try taking a stool softener (if your caregiver approves) if you develop constipation. Eat more high-fiber foods, such as fresh vegetables or fruit and whole grains. Drink plenty of fluids to keep your urine clear or pale yellow.  Take warm sitz baths to soothe any pain or discomfort caused by hemorrhoids. Use hemorrhoid cream if your caregiver approves.  If you develop varicose veins, wear support hose. Elevate your feet for 15 minutes, 3-4 times a day. Limit salt in your diet.  Avoid heavy lifting, wear low heal shoes, and practice good posture.  Rest a lot with your legs elevated if you have leg cramps or low back pain.  Visit your dentist if you have not gone during your pregnancy. Use a soft toothbrush to brush your teeth and be gentle when you floss.  A sexual relationship may be continued unless your caregiver directs you otherwise.  Do not travel far distances unless it is absolutely necessary and only with the approval  of your caregiver.  Take prenatal classes to understand, practice, and ask questions about the labor and delivery.  Make a trial run to the hospital.  Pack your hospital bag.  Prepare the baby's nursery.  Continue to go to all your prenatal visits as directed by your caregiver. SEEK MEDICAL CARE IF:  You are unsure if you are in labor or if your water has broken.  You have dizziness.  You have mild pelvic cramps, pelvic pressure, or nagging pain in your abdominal area.  You have persistent nausea, vomiting, or diarrhea.  You have a bad smelling vaginal discharge.  You have pain with urination. SEEK IMMEDIATE MEDICAL CARE IF:   You have a fever.  You are leaking fluid from your vagina.  You have spotting or bleeding from your vagina.  You have severe abdominal cramping or pain.  You have rapid weight loss or gain.  You have shortness of breath with chest pain.  You notice sudden or extreme swelling   of your face, hands, ankles, feet, or legs.  You have not felt your baby move in over an hour.  You have severe headaches that do not go away with medicine.  You have vision changes. Document Released: 10/27/2001 Document Revised: 11/07/2013 Document Reviewed: 01/03/2013 [employer] Patient Information 2015 [employer]. This information is not intended to replace advice given to you by your health care provider. Make sure you discuss any questions you have with your health care provider.  

## 2014-12-12 ENCOUNTER — Ambulatory Visit (INDEPENDENT_AMBULATORY_CARE_PROVIDER_SITE_OTHER): Payer: BLUE CROSS/BLUE SHIELD | Admitting: Obstetrics and Gynecology

## 2014-12-12 ENCOUNTER — Encounter: Payer: Self-pay | Admitting: Obstetrics and Gynecology

## 2014-12-12 VITALS — BP 111/73 | HR 85 | Wt 211.0 lb

## 2014-12-12 DIAGNOSIS — O99283 Endocrine, nutritional and metabolic diseases complicating pregnancy, third trimester: Secondary | ICD-10-CM

## 2014-12-12 DIAGNOSIS — E079 Disorder of thyroid, unspecified: Secondary | ICD-10-CM

## 2014-12-12 DIAGNOSIS — Z3483 Encounter for supervision of other normal pregnancy, third trimester: Secondary | ICD-10-CM

## 2014-12-12 NOTE — Patient Instructions (Addendum)
Third Trimester of Pregnancy The third trimester is from week 29 through week 42, months 7 through 9. The third trimester is a time when the fetus is growing rapidly. At the end of the ninth month, the fetus is about 20 inches in length and weighs 6-10 pounds.  BODY CHANGES Your body goes through many changes during pregnancy. The changes vary from woman to woman.   Your weight will continue to increase. You can expect to gain 25-35 pounds (11-16 kg) by the end of the pregnancy.  You may begin to get stretch marks on your hips, abdomen, and breasts.  You may urinate more often because the fetus is moving lower into your pelvis and pressing on your bladder.  You may develop or continue to have heartburn as a result of your pregnancy.  You may develop constipation because certain hormones are causing the muscles that push waste through your intestines to slow down.  You may develop hemorrhoids or swollen, bulging veins (varicose veins).  You may have pelvic pain because of the weight gain and pregnancy hormones relaxing your joints between the bones in your pelvis. Backaches may result from overexertion of the muscles supporting your posture.  You may have changes in your hair. These can include thickening of your hair, rapid growth, and changes in texture. Some women also have hair loss during or after pregnancy, or hair that feels dry or thin. Your hair will most likely return to normal after your baby is born.  Your breasts will continue to grow and be tender. A yellow discharge may leak from your breasts called colostrum.  Your belly button may stick out.  You may feel short of breath because of your expanding uterus.  You may notice the fetus "dropping," or moving lower in your abdomen.  You may have a bloody mucus discharge. This usually occurs a few days to a week before labor begins.  Your cervix becomes thin and soft (effaced) near your due date. WHAT TO EXPECT AT YOUR PRENATAL  EXAMS  You will have prenatal exams every 2 weeks until week 36. Then, you will have weekly prenatal exams. During a routine prenatal visit:  You will be weighed to make sure you and the fetus are growing normally.  Your blood pressure is taken.  Your abdomen will be measured to track your baby's growth.  The fetal heartbeat will be listened to.  Any test results from the previous visit will be discussed.  You may have a cervical check near your due date to see if you have effaced. At around 36 weeks, your caregiver will check your cervix. At the same time, your caregiver will also perform a test on the secretions of the vaginal tissue. This test is to determine if a type of bacteria, Group B streptococcus, is present. Your caregiver will explain this further. Your caregiver may ask you:  What your birth plan is.  How you are feeling.  If you are feeling the baby move.  If you have had any abnormal symptoms, such as leaking fluid, bleeding, severe headaches, or abdominal cramping.  If you have any questions. Other tests or screenings that may be performed during your third trimester include:  Blood tests that check for low iron levels (anemia).  Fetal testing to check the health, activity level, and growth of the fetus. Testing is done if you have certain medical conditions or if there are problems during the pregnancy. FALSE LABOR You may feel small, irregular contractions that   eventually go away. These are called Braxton Hicks contractions, or false labor. Contractions may last for hours, days, or even weeks before true labor sets in. If contractions come at regular intervals, intensify, or become painful, it is best to be seen by your caregiver.  SIGNS OF LABOR   Menstrual-like cramps.  Contractions that are 5 minutes apart or less.  Contractions that start on the top of the uterus and spread down to the lower abdomen and back.  A sense of increased pelvic pressure or back  pain.  A watery or bloody mucus discharge that comes from the vagina. If you have any of these signs before the 37th week of pregnancy, call your caregiver right away. You need to go to the hospital to get checked immediately. HOME CARE INSTRUCTIONS   Avoid all smoking, herbs, alcohol, and unprescribed drugs. These chemicals affect the formation and growth of the baby.  Follow your caregiver's instructions regarding medicine use. There are medicines that are either safe or unsafe to take during pregnancy.  Exercise only as directed by your caregiver. Experiencing uterine cramps is a good sign to stop exercising.  Continue to eat regular, healthy meals.  Wear a good support bra for breast tenderness.  Do not use hot tubs, steam rooms, or saunas.  Wear your seat belt at all times when driving.  Avoid raw meat, uncooked cheese, cat litter boxes, and soil used by cats. These carry germs that can cause birth defects in the baby.  Take your prenatal vitamins.  Try taking a stool softener (if your caregiver approves) if you develop constipation. Eat more high-fiber foods, such as fresh vegetables or fruit and whole grains. Drink plenty of fluids to keep your urine clear or pale yellow.  Take warm sitz baths to soothe any pain or discomfort caused by hemorrhoids. Use hemorrhoid cream if your caregiver approves.  If you develop varicose veins, wear support hose. Elevate your feet for 15 minutes, 3-4 times a day. Limit salt in your diet.  Avoid heavy lifting, wear low heal shoes, and practice good posture.  Rest a lot with your legs elevated if you have leg cramps or low back pain.  Visit your dentist if you have not gone during your pregnancy. Use a soft toothbrush to brush your teeth and be gentle when you floss.  A sexual relationship may be continued unless your caregiver directs you otherwise.  Do not travel far distances unless it is absolutely necessary and only with the approval  of your caregiver.  Take prenatal classes to understand, practice, and ask questions about the labor and delivery.  Make a trial run to the hospital.  Pack your hospital bag.  Prepare the baby's nursery.  Continue to go to all your prenatal visits as directed by your caregiver. SEEK MEDICAL CARE IF:  You are unsure if you are in labor or if your water has broken.  You have dizziness.  You have mild pelvic cramps, pelvic pressure, or nagging pain in your abdominal area.  You have persistent nausea, vomiting, or diarrhea.  You have a bad smelling vaginal discharge.  You have pain with urination. SEEK IMMEDIATE MEDICAL CARE IF:   You have a fever.  You are leaking fluid from your vagina.  You have spotting or bleeding from your vagina.  You have severe abdominal cramping or pain.  You have rapid weight loss or gain.  You have shortness of breath with chest pain.  You notice sudden or extreme swelling   of your face, hands, ankles, feet, or legs.  You have not felt your baby move in over an hour.  You have severe headaches that do not go away with medicine.  You have vision changes. Document Released: 10/27/2001 Document Revised: 11/07/2013 Document Reviewed: 01/03/2013 [facility] Patient Information 2015 [place], [place]. This information is not intended to replace advice given to you by your health care provider. Make sure you discuss any questions you have with your health care provider. Vaginal Birth After Cesarean Delivery Vaginal birth after cesarean delivery (VBAC) is giving birth vaginally after previously delivering a baby by a cesarean. In the past, if a woman had a cesarean delivery, all births afterward would be done by cesarean delivery. This is no longer true. It can be safe for the mother to try a vaginal delivery after having a cesarean delivery.  It is important to discuss VBAC with your health care provider early in the pregnancy so you can understand the  risks, benefits, and options. It will give you time to decide what is best in your particular case. The final decision about whether to have a VBAC or repeat cesarean delivery should be between you and your health care provider. Any changes in your health or your baby's health during your pregnancy may make it necessary to change your initial decision about VBAC.  WOMEN WHO PLAN TO HAVE A VBAC SHOULD CHECK WITH THEIR HEALTH CARE PROVIDER TO BE SURE THAT:  The previous cesarean delivery was done with a low transverse uterine cut (incision) (not a vertical classical incision).   The birth canal is big enough for the baby.   There were no other operations on the uterus.   An electronic fetal monitor (EFM) will be on at all times during labor.   An operating room will be available and ready in case an emergency cesarean delivery is needed.   A health care provider and surgical nursing staff will be available at all times during labor to be ready to do an emergency delivery cesarean if necessary.   An anesthesiologist will be present in case an emergency cesarean delivery is needed.   The nursery is prepared and has adequate personnel and necessary equipment available to care for the baby in case of an emergency cesarean delivery. BENEFITS OF VBAC  Shorter stay in the hospital.   Avoidance of risks associated with cesarean delivery, such as:  Surgical complications, such as opening of the incision or hernia in the incision.  Injury to other organs.  Fever. This can occur if an infection develops after surgery. It can also occur as a reaction to the medicine given to make you numb during the surgery.  Less blood loss and need for blood transfusions.  Lower risk of blood clots and infection.  Shorter recovery.   Decreased risk for having to remove the uterus (hysterectomy).   Decreased risk for the placenta to completely or partially cover the opening of the uterus (placenta  previa) with a future pregnancy.   Decrease risk in future labor and delivery. RISKS OF A VBAC  Tearing (rupture) of the uterus. This is occurs in less than 1% of VBACs. The risk of this happening is higher if:  Steps are taken to begin the labor process (induce labor) or stimulate or strengthen contractions (augment labor).   Medicine is used to soften (ripen) the cervix.  Having to remove the uterus (hysterectomy) if it ruptures. VBAC SHOULD NOT BE DONE IF:  The previous cesarean delivery  was done with a vertical (classical) or T-shaped incision or you do not know what kind of incision was made.   You had a ruptured uterus.   You have had certain types of surgery on your uterus, such as removal of uterine fibroids. Ask your health care provider about other types of surgeries that prevent you from having a VBAC.  You have certain medical or childbirth (obstetrical) problems.   There are problems with the baby.   You have had two previous cesarean deliveries and no vaginal deliveries. OTHER FACTS TO KNOW ABOUT VBAC:  It is safe to have an epidural anesthetic with VBAC.   It is safe to turn the baby from a breech position (attempt an external cephalic version).   It is safe to try a VBAC with twins.   VBAC may not be successful if your baby weights 8.8 lb (4 kg) or more. However, weight predictions are not always accurate and should not be used alone to decide if VBAC is right for you.  There is an increased failure rate if the time between the cesarean delivery and VBAC is less than 19 months.   Your health care provider may advise against a VBAC if you have preeclampsia (high blood pressure, protein in the urine, and swelling of face and extremities).   VBAC is often successful if you previously gave birth vaginally.   VBAC is often successful when the labor starts spontaneously before the due date.   Delivering a baby through a VBAC is similar to having a  normal spontaneous vaginal delivery. Document Released: 04/25/2007 Document Revised: 03/19/2014 Document Reviewed: 06/01/2013 [facility] Patient Information 2015 [place], [place]. This information is not intended to replace advice given to you by your health care provider. Make sure you discuss any questions you have with your health care provider.

## 2014-12-12 NOTE — Progress Notes (Signed)
Doing well.  Considering TOLAC. VBAC consent given>will sign next week. Cultures and TSH next.

## 2014-12-20 ENCOUNTER — Encounter: Payer: Self-pay | Admitting: Obstetrics and Gynecology

## 2014-12-20 ENCOUNTER — Ambulatory Visit (INDEPENDENT_AMBULATORY_CARE_PROVIDER_SITE_OTHER): Payer: BLUE CROSS/BLUE SHIELD | Admitting: Obstetrics and Gynecology

## 2014-12-20 VITALS — BP 132/96 | HR 122 | Wt 212.0 lb

## 2014-12-20 DIAGNOSIS — E079 Disorder of thyroid, unspecified: Secondary | ICD-10-CM

## 2014-12-20 DIAGNOSIS — O3421 Maternal care for scar from previous cesarean delivery: Secondary | ICD-10-CM

## 2014-12-20 DIAGNOSIS — Z36 Encounter for antenatal screening of mother: Secondary | ICD-10-CM | POA: Diagnosis not present

## 2014-12-20 DIAGNOSIS — O09523 Supervision of elderly multigravida, third trimester: Secondary | ICD-10-CM

## 2014-12-20 DIAGNOSIS — O360131 Maternal care for anti-D [Rh] antibodies, third trimester, fetus 1: Secondary | ICD-10-CM

## 2014-12-20 DIAGNOSIS — O99283 Endocrine, nutritional and metabolic diseases complicating pregnancy, third trimester: Secondary | ICD-10-CM

## 2014-12-20 DIAGNOSIS — Z3483 Encounter for supervision of other normal pregnancy, third trimester: Secondary | ICD-10-CM

## 2014-12-20 DIAGNOSIS — O34219 Maternal care for unspecified type scar from previous cesarean delivery: Secondary | ICD-10-CM

## 2014-12-20 LAB — OB RESULTS CONSOLE GC/CHLAMYDIA
CHLAMYDIA, DNA PROBE: NEGATIVE
GC PROBE AMP, GENITAL: NEGATIVE

## 2014-12-20 LAB — OB RESULTS CONSOLE GBS: GBS: NEGATIVE

## 2014-12-20 NOTE — Progress Notes (Signed)
36 week cultures today

## 2014-12-20 NOTE — Progress Notes (Signed)
Patient is doing well without complaints. FM/labor precautions reviewed. Cultures collected and TSH collected. Patient still desires TOLAC

## 2014-12-21 LAB — GC/CHLAMYDIA PROBE AMP
CT PROBE, AMP APTIMA: NEGATIVE
GC Probe RNA: NEGATIVE

## 2014-12-21 LAB — TSH: TSH: 3.016 u[IU]/mL (ref 0.350–4.500)

## 2014-12-22 LAB — CULTURE, BETA STREP (GROUP B ONLY)

## 2014-12-27 ENCOUNTER — Encounter: Payer: Self-pay | Admitting: Obstetrics & Gynecology

## 2014-12-27 ENCOUNTER — Ambulatory Visit (INDEPENDENT_AMBULATORY_CARE_PROVIDER_SITE_OTHER): Payer: BLUE CROSS/BLUE SHIELD | Admitting: Obstetrics & Gynecology

## 2014-12-27 VITALS — BP 134/89 | HR 108 | Wt 214.0 lb

## 2014-12-27 DIAGNOSIS — Z3403 Encounter for supervision of normal first pregnancy, third trimester: Secondary | ICD-10-CM

## 2014-12-27 NOTE — Progress Notes (Signed)
Pt with occ ctx. NO LOF or VB. Husband has GI bug- rec freq handwashing of pt and son Reviewed labor precautions Reviewed GBS and TSH results

## 2014-12-27 NOTE — Patient Instructions (Signed)

## 2014-12-28 ENCOUNTER — Encounter (HOSPITAL_COMMUNITY): Payer: Self-pay

## 2014-12-28 ENCOUNTER — Ambulatory Visit (HOSPITAL_COMMUNITY)
Admission: RE | Admit: 2014-12-28 | Discharge: 2014-12-28 | Disposition: A | Discharge status: Home / Self Care | Payer: BLUE CROSS/BLUE SHIELD | Source: Ambulatory Visit | Attending: Family Medicine | Admitting: Family Medicine

## 2014-12-28 VITALS — BP 146/84 | HR 1

## 2014-12-28 DIAGNOSIS — Z3A33 33 weeks gestation of pregnancy: Secondary | ICD-10-CM

## 2014-12-28 DIAGNOSIS — O3421 Maternal care for scar from previous cesarean delivery: Secondary | ICD-10-CM | POA: Insufficient documentation

## 2014-12-28 DIAGNOSIS — E039 Hypothyroidism, unspecified: Secondary | ICD-10-CM | POA: Diagnosis not present

## 2014-12-28 DIAGNOSIS — O4402 Placenta previa specified as without hemorrhage, second trimester: Secondary | ICD-10-CM

## 2014-12-28 DIAGNOSIS — Z3A38 38 weeks gestation of pregnancy: Secondary | ICD-10-CM | POA: Diagnosis not present

## 2014-12-28 DIAGNOSIS — O360131 Maternal care for anti-D [Rh] antibodies, third trimester, fetus 1: Secondary | ICD-10-CM

## 2014-12-28 DIAGNOSIS — O09523 Supervision of elderly multigravida, third trimester: Secondary | ICD-10-CM | POA: Diagnosis not present

## 2014-12-28 DIAGNOSIS — O4440 Low lying placenta NOS or without hemorrhage, unspecified trimester: Secondary | ICD-10-CM

## 2014-12-28 DIAGNOSIS — E079 Disorder of thyroid, unspecified: Secondary | ICD-10-CM

## 2014-12-28 DIAGNOSIS — O34219 Maternal care for unspecified type scar from previous cesarean delivery: Secondary | ICD-10-CM

## 2014-12-28 DIAGNOSIS — Z3483 Encounter for supervision of other normal pregnancy, third trimester: Secondary | ICD-10-CM

## 2014-12-28 DIAGNOSIS — O99283 Endocrine, nutritional and metabolic diseases complicating pregnancy, third trimester: Secondary | ICD-10-CM

## 2015-01-03 ENCOUNTER — Inpatient Hospital Stay (HOSPITAL_COMMUNITY)
Admission: AD | Admit: 2015-01-03 | Discharge: 2015-01-06 | DRG: 987 | Disposition: A | Discharge status: Home / Self Care | Payer: BLUE CROSS/BLUE SHIELD | Source: Ambulatory Visit | Attending: Obstetrics & Gynecology | Admitting: Obstetrics & Gynecology

## 2015-01-03 ENCOUNTER — Encounter (HOSPITAL_COMMUNITY): Payer: Self-pay | Admitting: *Deleted

## 2015-01-03 ENCOUNTER — Ambulatory Visit (INDEPENDENT_AMBULATORY_CARE_PROVIDER_SITE_OTHER): Payer: BLUE CROSS/BLUE SHIELD | Admitting: Obstetrics & Gynecology

## 2015-01-03 VITALS — BP 132/92 | HR 111 | Wt 215.0 lb

## 2015-01-03 DIAGNOSIS — O4593 Premature separation of placenta, unspecified, third trimester: Secondary | ICD-10-CM | POA: Diagnosis present

## 2015-01-03 DIAGNOSIS — Z6833 Body mass index (BMI) 33.0-33.9, adult: Secondary | ICD-10-CM

## 2015-01-03 DIAGNOSIS — O133 Gestational [pregnancy-induced] hypertension without significant proteinuria, third trimester: Principal | ICD-10-CM | POA: Diagnosis present

## 2015-01-03 DIAGNOSIS — Z833 Family history of diabetes mellitus: Secondary | ICD-10-CM | POA: Diagnosis not present

## 2015-01-03 DIAGNOSIS — O24419 Gestational diabetes mellitus in pregnancy, unspecified control: Secondary | ICD-10-CM | POA: Diagnosis present

## 2015-01-03 DIAGNOSIS — O360930 Maternal care for other rhesus isoimmunization, third trimester, not applicable or unspecified: Secondary | ICD-10-CM | POA: Diagnosis present

## 2015-01-03 DIAGNOSIS — Z3483 Encounter for supervision of other normal pregnancy, third trimester: Secondary | ICD-10-CM

## 2015-01-03 DIAGNOSIS — Z8249 Family history of ischemic heart disease and other diseases of the circulatory system: Secondary | ICD-10-CM

## 2015-01-03 DIAGNOSIS — O09523 Supervision of elderly multigravida, third trimester: Secondary | ICD-10-CM | POA: Diagnosis not present

## 2015-01-03 DIAGNOSIS — O360131 Maternal care for anti-D [Rh] antibodies, third trimester, fetus 1: Secondary | ICD-10-CM

## 2015-01-03 DIAGNOSIS — Z823 Family history of stroke: Secondary | ICD-10-CM

## 2015-01-03 DIAGNOSIS — O3421 Maternal care for scar from previous cesarean delivery: Secondary | ICD-10-CM | POA: Diagnosis present

## 2015-01-03 DIAGNOSIS — O34219 Maternal care for unspecified type scar from previous cesarean delivery: Secondary | ICD-10-CM

## 2015-01-03 DIAGNOSIS — Z3A38 38 weeks gestation of pregnancy: Secondary | ICD-10-CM | POA: Diagnosis present

## 2015-01-03 DIAGNOSIS — O99214 Obesity complicating childbirth: Secondary | ICD-10-CM | POA: Diagnosis present

## 2015-01-03 DIAGNOSIS — O99810 Abnormal glucose complicating pregnancy: Secondary | ICD-10-CM | POA: Insufficient documentation

## 2015-01-03 LAB — URINALYSIS, ROUTINE W REFLEX MICROSCOPIC
BILIRUBIN URINE: NEGATIVE
Glucose, UA: NEGATIVE mg/dL
Ketones, ur: >80 mg/dL — AB
Nitrite: NEGATIVE
Protein, ur: NEGATIVE mg/dL
SPECIFIC GRAVITY, URINE: 1.015 (ref 1.005–1.030)
UROBILINOGEN UA: 0.2 mg/dL (ref 0.0–1.0)
pH: 6 (ref 5.0–8.0)

## 2015-01-03 LAB — COMPREHENSIVE METABOLIC PANEL
ALK PHOS: 104 U/L (ref 39–117)
ALT: 18 U/L (ref 0–35)
ANION GAP: 6 (ref 5–15)
AST: 20 U/L (ref 0–37)
Albumin: 3.1 g/dL — ABNORMAL LOW (ref 3.5–5.2)
BUN: 8 mg/dL (ref 6–23)
CO2: 20 mmol/L (ref 19–32)
Calcium: 8 mg/dL — ABNORMAL LOW (ref 8.4–10.5)
Chloride: 107 mmol/L (ref 96–112)
Creatinine, Ser: 0.58 mg/dL (ref 0.50–1.10)
GFR calc non Af Amer: >90 mL/min (ref >90)
GLUCOSE: 87 mg/dL (ref 70–99)
Potassium: 3.9 mmol/L (ref 3.5–5.1)
Sodium: 133 mmol/L — ABNORMAL LOW (ref 135–145)
Total Bilirubin: 0.5 mg/dL (ref 0.3–1.2)
Total Protein: 6.7 g/dL (ref 6.0–8.3)

## 2015-01-03 LAB — CBC
HCT: 37.6 % (ref 36.0–46.0)
HEMOGLOBIN: 12.8 g/dL (ref 12.0–15.0)
MCH: 31.1 pg (ref 26.0–34.0)
MCHC: 34 g/dL (ref 30.0–36.0)
MCV: 91.5 fL (ref 78.0–100.0)
Platelets: 253 10*3/uL (ref 150–400)
RBC: 4.11 MIL/uL (ref 3.87–5.11)
RDW: 13.6 % (ref 11.5–15.5)
WBC: 10 10*3/uL (ref 4.0–10.5)

## 2015-01-03 LAB — HEPATITIS B SURFACE ANTIGEN: HEP B S AG: NEGATIVE

## 2015-01-03 LAB — URINE MICROSCOPIC-ADD ON

## 2015-01-03 LAB — PROTEIN / CREATININE RATIO, URINE
Creatinine, Urine: 85 mg/dL
Protein Creatinine Ratio: 0.09 (ref 0.00–0.15)
Total Protein, Urine: 8 mg/dL

## 2015-01-03 LAB — TYPE AND SCREEN
ABO/RH(D): O NEG
ANTIBODY SCREEN: NEGATIVE

## 2015-01-03 MED ORDER — OXYTOCIN BOLUS FROM INFUSION: 500.0000 mL | INTRAVENOUS | Status: DC

## 2015-01-03 MED ORDER — OXYCODONE-ACETAMINOPHEN 5-325 MG PO TABS: 1.0000 | ORAL_TABLET | ORAL | Status: DC | PRN

## 2015-01-03 MED ORDER — LACTATED RINGERS IV SOLN
500.0000 mL | INTRAVENOUS | Status: DC | PRN
  Administered 2015-01-03: 500 mL via INTRAVENOUS

## 2015-01-03 MED ORDER — LACTATED RINGERS IV SOLN
INTRAVENOUS | Status: DC
  Administered 2015-01-03 – 2015-01-04 (×2): via INTRAVENOUS

## 2015-01-03 MED ORDER — PRENATAL MULTIVITAMIN CH
1.0000 | ORAL_TABLET | Freq: Every day | ORAL | Status: DC
  Administered 2015-01-05 – 2015-01-06 (×2): 1 via ORAL
  Filled 2015-01-03 (×2): qty 1

## 2015-01-03 MED ORDER — LIDOCAINE HCL (PF) 1 % IJ SOLN
30.0000 mL | INTRAMUSCULAR | Status: DC | PRN
  Filled 2015-01-03: qty 30

## 2015-01-03 MED ORDER — OXYTOCIN 40 UNITS IN LACTATED RINGERS INFUSION - SIMPLE MED
62.5000 mL/h | INTRAVENOUS | Status: DC
  Filled 2015-01-03: qty 1000

## 2015-01-03 MED ORDER — ACETAMINOPHEN 325 MG PO TABS
650.0000 mg | ORAL_TABLET | ORAL | Status: DC | PRN
  Administered 2015-01-03: 650 mg via ORAL
  Filled 2015-01-03: qty 2

## 2015-01-03 MED ORDER — FENTANYL CITRATE 0.05 MG/ML IJ SOLN
100.0000 ug | INTRAMUSCULAR | Status: DC | PRN
  Administered 2015-01-03 – 2015-01-04 (×2): 100 ug via INTRAVENOUS
  Filled 2015-01-03 (×2): qty 2

## 2015-01-03 MED ORDER — THYROID 60 MG PO TABS
60.0000 mg | ORAL_TABLET | Freq: Every day | ORAL | Status: DC
  Administered 2015-01-05 – 2015-01-06 (×2): 60 mg via ORAL
  Filled 2015-01-03 (×3): qty 1

## 2015-01-03 MED ORDER — FENTANYL CITRATE 0.05 MG/ML IJ SOLN: 100.0000 ug | INTRAMUSCULAR | Status: DC | PRN

## 2015-01-03 MED ORDER — ONDANSETRON HCL 4 MG/2ML IJ SOLN: 4.0000 mg | Freq: Four times a day (QID) | INTRAMUSCULAR | Status: DC | PRN

## 2015-01-03 MED ORDER — FENTANYL CITRATE 0.05 MG/ML IJ SOLN
100.0000 ug | Freq: Once | INTRAMUSCULAR | Status: AC
  Administered 2015-01-03: 100 ug via INTRAVENOUS
  Filled 2015-01-03: qty 2

## 2015-01-03 MED ORDER — OXYCODONE-ACETAMINOPHEN 5-325 MG PO TABS: 2.0000 | ORAL_TABLET | ORAL | Status: DC | PRN

## 2015-01-03 MED ORDER — CITRIC ACID-SODIUM CITRATE 334-500 MG/5ML PO SOLN: 30.0000 mL | ORAL | Status: DC | PRN

## 2015-01-03 NOTE — Patient Instructions (Signed)

## 2015-01-03 NOTE — H&P (Signed)
LABOR ADMISSION HISTORY AND PHYSICAL  Catherine Mckenzie is a 34 y.o. female G45P1011 with IUP at [redacted]w[redacted]d by 6 week [place] presenting for induction of labor. Initially presented to [facility] clinic where she was noted to meet the criteria for gestational hypertension (blood pressure of 132/96 on 2/4, 146/84 on 2/12, and 132/92 today). NST in clinic was reactive with no contractions noted. She was then sent to [facility] for induction of labor. She reports +FMs, No LOF, no VB, no blurry vision, headaches or peripheral edema, and RUQ pain.  She plans on breast feeding. She request POPs for birth control. History of c-section for failure to progress in 2012 with 8pounds 5oz baby.  Patient Active Problem List   Diagnosis Date Noted  . Gestational HTN 01/03/2015  . Gestational diabetes 01/03/2015  . Thyroid activity decreased   . Elderly multigravida   . Rh negative state in antepartum period 10/15/2014  . Previous cesarean section complicating pregnancy, antepartum 09/18/2014  . Antepartum multigravida of advanced maternal age 20/01/2014  . Supervision of other normal pregnancy 05/25/2014  . Thyroid dysfunction in pregnancy 05/25/2014  . Celiac sprue 04/03/2013  . Obesity 07/05/2012  . Migraine headache without aura   . Hypothyroidism 04/27/2007  . FIBROADENOMA, BREAST 04/27/2007   Past Medical History: Past Medical History  Diagnosis Date  . Fibroadenoma     left breast  . Thyroid disease   . Allergy   . Migraine headache without aura   . FIBROADENOMA, BREAST 04/27/2007    Qualifier: Diagnosis of  By: Fuller Plan CMA (AAMA), Terri Skains      Past Surgical History: Past Surgical History  Procedure Laterality Date  . Cesarean section    . Wisdom tooth extraction      x4    Obstetrical History: OB History    Gravida Para Term Preterm AB TAB SAB Ectopic Multiple Living   3 1 1  1  1   1       Social History: History   Social History  . Marital Status: Married    Spouse Name: N/A  . Number of  Children: N/A  . Years of Education: N/A   Social History Main Topics  . Smoking status: Never Smoker   . Smokeless tobacco: Never Used  . Alcohol Use: Yes     Comment: rarely  . Drug Use: No  . Sexual Activity:    Partners: Male    Birth Control/ Protection: None   Other Topics Concern  . Not on file   Social History Narrative    Family History: Family History  Problem Relation Age of Onset  . Diabetes Mother   . Hypertension Mother   . Hyperlipidemia Mother   . Stroke Mother     TIA  . Asthma Sister   . Depression Sister   . Cancer Maternal Grandmother 50    PANCREATIC  . Heart disease Maternal Grandfather   . Hypertension Maternal Grandfather   . Hypertension Paternal Grandmother   . Cancer Paternal Grandmother     BREAST  . Stroke Paternal Grandmother   . Cancer Paternal Grandfather     PROSTATE  . Seizures Paternal Grandfather   . Hypertension Paternal Grandfather   . Stroke Paternal Grandfather     Allergies: Allergies  Allergen Reactions  . Levaquin [Levofloxacin] Other (See Comments)    panic attacks  . Moxifloxacin Other (See Comments)    panic attacks  . Benzocaine Rash  . Benzoyl Peroxide-Erythromycin Rash    Prescriptions prior  to admission  Medication Sig Dispense Refill Last Dose  . acetaminophen (TYLENOL) 500 MG tablet Take 1,000 mg by mouth every 6 (six) hours as needed. For migraine   Past Week at Unknown time  . cetirizine (ZYRTEC) 10 MG tablet Take 10 mg by mouth daily.   01/02/2015 at Unknown time  . ergocalciferol (DRISDOL) 8000 UNIT/ML drops Take 4,000 Units by mouth daily.    01/03/2015 at Unknown time  . NONFORMULARY OR COMPOUNDED ITEM Take 1 capsule by mouth daily. MMB 5mg /2.5mg /50 mg   Past Month at Unknown time  . Prenatal Vit-Fe Fumarate-FA (PRENATAL MULTIVITAMIN) TABS tablet Take 1 tablet by mouth daily at 12 noon.   01/02/2015 at Unknown time  . terbinafine (LAMISIL) 1 % cream Apply 1 application topically daily.   01/03/2015 at  Unknown time  . thyroid (ARMOUR THYROID) 60 MG tablet Take 2 tablets (120 mg total) by mouth daily before breakfast. (Patient taking differently: Take 60 mg by mouth daily before breakfast. ) 60 tablet 3 01/03/2015 at Unknown time  . EPIPEN 2-PAK 0.3 MG/0.3ML SOAJ Inject 0.03 mg into the muscle as needed.   Taking  . Ipratropium-Albuterol (COMBIVENT) 20-100 MCG/ACT AERS respimat Inhale 1 puff into the lungs every 6 (six) hours. 1 Inhaler 5 Taking  . Tuberculin-Allergy Syringes (ALLERGY SYRINGE 1CC/27GX1/2") 27G X 1/2" 1 ML MISC 1 each by Does not apply route once a week. Pt stated that she gets allergy injections weekly   Taking   Review of Systems   All systems reviewed and negative except as stated in HPI  Blood pressure 137/83, pulse 111, temperature 98 F (36.7 C), temperature source Oral, resp. rate 16, height 5\' 7"  (1.702 m), weight 97.523 kg (215 lb), last menstrual period 04/06/2014, unknown if currently breastfeeding. General appearance: alert, cooperative and no distress Lungs: No increased work of breathing Abdomen: soft, non-tender Presentation: cephalic Fetal monitoringBaseline: 160 bpm, Variability: Good {> 6 bpm), Accelerations: Reactive and Decelerations: Absent Uterine activityNone   Prenatal labs: ABO, Rh: --/--/O NEG (05/23 2050) Antibody: NEG (11/30 1655) Rubella:  Immune RPR: NON REAC (11/30 1626)  HBsAg:   Ordered HIV: NONREACTIVE (11/30 1626)   Rhophylac 10/15/14   [employer]  Dating LMP + 6 wk U/s  Genetic Screen NIPS: Panorama -> Normal female  Anatomic [place] Normal at 23 weeks, low lying placenta 1.3 cm from os - resolved at 28 weeks>> 33 wks showed 39% growth  GTT Third trimester: 133  TDaP vaccine  11/15  Flu vaccine  10/15  GBS negative  Contraception POP's  Baby Food breast  Circumcision yes  Pediatrician Florian Buff  Support Person Angus Seller    Results for orders placed or performed during the hospital encounter of 01/03/15 (from the  past 24 hour(s))  CBC   Collection Time: 01/03/15  3:30 PM  Result Value Ref Range   WBC 10.0 4.0 - 10.5 K/uL   RBC 4.11 3.87 - 5.11 MIL/uL   Hemoglobin 12.8 12.0 - 15.0 g/dL   HCT 37.6 36.0 - 46.0 %   MCV 91.5 78.0 - 100.0 fL   MCH 31.1 26.0 - 34.0 pg   MCHC 34.0 30.0 - 36.0 g/dL   RDW 13.6 11.5 - 15.5 %   Platelets 253 150 - 400 K/uL    Patient Active Problem List   Diagnosis Date Noted  . Gestational HTN 01/03/2015  . Gestational diabetes 01/03/2015  . Thyroid activity decreased   . Elderly multigravida   . Rh negative state  in antepartum period 10/15/2014  . Previous cesarean section complicating pregnancy, antepartum 09/18/2014  . Antepartum multigravida of advanced maternal age 20/01/2014  . Supervision of other normal pregnancy 05/25/2014  . Thyroid dysfunction in pregnancy 05/25/2014  . Celiac sprue 04/03/2013  . Obesity 07/05/2012  . Migraine headache without aura   . Hypothyroidism 04/27/2007  . FIBROADENOMA, BREAST 04/27/2007   Assessment: Catherine Mckenzie is a 34 y.o. G3P1011 at [redacted]w[redacted]d here for induction of labor. - Fentanyl PRN pain - Will place foley bulb. Cytotec contraindicated due to history of c-section for failure to progress. - Monitor Blood Pressure. Currently 137/83. Initiated Magnesium if >160/110 - Follow up Pre-eclampsia workup (CBC, CMP, Protein-Creatinine ratio, Urinalysis) - GBS negative - Plans for POPs for contraception  - Plans for circumcision. Will need to discuss inpatient vs. Outpatient - Plans to breast feed.  Lorna Few 01/03/2015, 5:09 PM  Attending: Ihor Dow  I was present for the exam and agree with above. HBsAg ordered.  [place], [place] 01/03/2015 5:56 PM

## 2015-01-03 NOTE — Progress Notes (Signed)
Patient meets criteria for GHTN: three episodes of elevated BP on 2/4 132/96; 2/12 146/84; 2/18 132/92.  No protein in urine, no severe features for now. Will send to [facility] for IOL of labor. RN in charge, L&D team notified NST done here in clinic was reactive, no contractions Patient will follow up for postpartum care

## 2015-01-04 ENCOUNTER — Inpatient Hospital Stay (HOSPITAL_COMMUNITY): Payer: BLUE CROSS/BLUE SHIELD | Admitting: Anesthesiology

## 2015-01-04 ENCOUNTER — Encounter (HOSPITAL_COMMUNITY): Payer: Self-pay | Admitting: *Deleted

## 2015-01-04 DIAGNOSIS — O133 Gestational [pregnancy-induced] hypertension without significant proteinuria, third trimester: Secondary | ICD-10-CM

## 2015-01-04 DIAGNOSIS — Z3A38 38 weeks gestation of pregnancy: Secondary | ICD-10-CM

## 2015-01-04 LAB — CBC
HCT: 35.5 % — ABNORMAL LOW (ref 36.0–46.0)
HEMATOCRIT: 31.8 % — AB (ref 36.0–46.0)
Hemoglobin: 12.3 g/dL (ref 12.0–15.0)
Hemoglobin: 10.7 g/dL — ABNORMAL LOW (ref 12.0–15.0)
MCH: 31.9 pg (ref 26.0–34.0)
MCH: 31.1 pg (ref 26.0–34.0)
MCHC: 34.6 g/dL (ref 30.0–36.0)
MCHC: 33.6 g/dL (ref 30.0–36.0)
MCV: 92.2 fL (ref 78.0–100.0)
MCV: 92.4 fL (ref 78.0–100.0)
PLATELETS: 245 10*3/uL (ref 150–400)
Platelets: 250 10*3/uL (ref 150–400)
RBC: 3.85 MIL/uL — ABNORMAL LOW (ref 3.87–5.11)
RBC: 3.44 MIL/uL — ABNORMAL LOW (ref 3.87–5.11)
RDW: 13.8 % (ref 11.5–15.5)
RDW: 13.8 % (ref 11.5–15.5)
WBC: 12.1 10*3/uL — AB (ref 4.0–10.5)
WBC: 20.3 10*3/uL — ABNORMAL HIGH (ref 4.0–10.5)

## 2015-01-04 LAB — RPR: RPR Ser Ql: NONREACTIVE

## 2015-01-04 LAB — RUBELLA SCREEN: Rubella: 1.17 index (ref >0.99)

## 2015-01-04 MED ORDER — FENTANYL 2.5 MCG/ML BUPIVACAINE 1/10 % EPIDURAL INFUSION (WH - ANES)
INTRAMUSCULAR | Status: DC | PRN
  Administered 2015-01-04: 14 mL/h via EPIDURAL

## 2015-01-04 MED ORDER — DIPHENHYDRAMINE HCL 25 MG PO CAPS: 25.0000 mg | ORAL_CAPSULE | Freq: Four times a day (QID) | ORAL | Status: DC | PRN

## 2015-01-04 MED ORDER — PHENYLEPHRINE 40 MCG/ML (10ML) SYRINGE FOR IV PUSH (FOR BLOOD PRESSURE SUPPORT)
80.0000 ug | PREFILLED_SYRINGE | INTRAVENOUS | Status: DC | PRN
  Filled 2015-01-04: qty 2

## 2015-01-04 MED ORDER — ONDANSETRON HCL 4 MG PO TABS: 4.0000 mg | ORAL_TABLET | ORAL | Status: DC | PRN

## 2015-01-04 MED ORDER — SENNOSIDES-DOCUSATE SODIUM 8.6-50 MG PO TABS
2.0000 | ORAL_TABLET | ORAL | Status: DC
  Administered 2015-01-05: 2 via ORAL
  Filled 2015-01-04: qty 2

## 2015-01-04 MED ORDER — IBUPROFEN 600 MG PO TABS
600.0000 mg | ORAL_TABLET | Freq: Four times a day (QID) | ORAL | Status: DC
  Administered 2015-01-04 – 2015-01-06 (×8): 600 mg via ORAL
  Filled 2015-01-04 (×8): qty 1

## 2015-01-04 MED ORDER — TERBUTALINE SULFATE 1 MG/ML IJ SOLN
0.2500 mg | Freq: Once | INTRAMUSCULAR | Status: DC | PRN
  Filled 2015-01-04: qty 1

## 2015-01-04 MED ORDER — LIDOCAINE HCL (PF) 1 % IJ SOLN
INTRAMUSCULAR | Status: DC | PRN
  Administered 2015-01-04 (×2): 4 mL

## 2015-01-04 MED ORDER — FENTANYL 2.5 MCG/ML BUPIVACAINE 1/10 % EPIDURAL INFUSION (WH - ANES)
INTRAMUSCULAR | Status: AC
  Filled 2015-01-04: qty 125

## 2015-01-04 MED ORDER — DIBUCAINE 1 % RE OINT
1.0000 "application " | TOPICAL_OINTMENT | RECTAL | Status: DC | PRN
  Filled 2015-01-04: qty 28

## 2015-01-04 MED ORDER — FENTANYL 2.5 MCG/ML BUPIVACAINE 1/10 % EPIDURAL INFUSION (WH - ANES)
14.0000 mL/h | INTRAMUSCULAR | Status: DC | PRN
Start: 2015-01-04 — End: 2015-01-04
  Administered 2015-01-04 (×2): 14 mL/h via EPIDURAL
  Filled 2015-01-04: qty 125

## 2015-01-04 MED ORDER — OXYTOCIN 40 UNITS IN LACTATED RINGERS INFUSION - SIMPLE MED
1.0000 m[IU]/min | INTRAVENOUS | Status: DC
  Administered 2015-01-04: 2 m[IU]/min via INTRAVENOUS

## 2015-01-04 MED ORDER — OXYCODONE-ACETAMINOPHEN 5-325 MG PO TABS: 2.0000 | ORAL_TABLET | ORAL | Status: DC | PRN

## 2015-01-04 MED ORDER — EPHEDRINE 5 MG/ML INJ
10.0000 mg | INTRAVENOUS | Status: DC | PRN
  Filled 2015-01-04: qty 2

## 2015-01-04 MED ORDER — ZOLPIDEM TARTRATE 5 MG PO TABS: 5.0000 mg | ORAL_TABLET | Freq: Every evening | ORAL | Status: DC | PRN

## 2015-01-04 MED ORDER — ONDANSETRON HCL 4 MG/2ML IJ SOLN
4.0000 mg | INTRAMUSCULAR | Status: DC | PRN
Start: 2015-01-04 — End: 2015-01-06

## 2015-01-04 MED ORDER — OXYCODONE-ACETAMINOPHEN 5-325 MG PO TABS
1.0000 | ORAL_TABLET | ORAL | Status: DC | PRN
  Administered 2015-01-04 – 2015-01-05 (×2): 1 via ORAL
  Filled 2015-01-04 (×2): qty 1

## 2015-01-04 MED ORDER — LANOLIN HYDROUS EX OINT: TOPICAL_OINTMENT | CUTANEOUS | Status: DC | PRN

## 2015-01-04 MED ORDER — SIMETHICONE 80 MG PO CHEW: 80.0000 mg | CHEWABLE_TABLET | ORAL | Status: DC | PRN

## 2015-01-04 MED ORDER — DIPHENHYDRAMINE HCL 50 MG/ML IJ SOLN: 12.5000 mg | INTRAMUSCULAR | Status: DC | PRN

## 2015-01-04 MED ORDER — OXYTOCIN 40 UNITS IN LACTATED RINGERS INFUSION - SIMPLE MED: 62.5000 mL/h | INTRAVENOUS | Status: DC | PRN

## 2015-01-04 MED ORDER — WITCH HAZEL-GLYCERIN EX PADS: 1.0000 "application " | MEDICATED_PAD | CUTANEOUS | Status: DC | PRN

## 2015-01-04 MED ORDER — PHENYLEPHRINE 40 MCG/ML (10ML) SYRINGE FOR IV PUSH (FOR BLOOD PRESSURE SUPPORT)
PREFILLED_SYRINGE | INTRAVENOUS | Status: AC
  Filled 2015-01-04: qty 20

## 2015-01-04 MED ORDER — LACTATED RINGERS IV SOLN
500.0000 mL | Freq: Once | INTRAVENOUS | Status: AC
  Administered 2015-01-04: 500 mL via INTRAVENOUS

## 2015-01-04 NOTE — Anesthesia Procedure Notes (Signed)
Epidural Patient location during procedure: OB Start time: 01/04/2015 3:20 AM  Staffing Anesthesiologist: Milana Obey Performed by: anesthesiologist   Preanesthetic Checklist Completed: patient identified, site marked, surgical consent, pre-op evaluation, timeout performed, IV checked, risks and benefits discussed and monitors and equipment checked  Epidural Patient position: sitting Prep: site prepped and draped and DuraPrep Patient monitoring: continuous pulse ox and blood pressure Approach: midline Location: L3-L4 Injection technique: LOR saline  Needle:  Needle type: Tuohy  Needle gauge: 17 G Needle length: 9 cm and 9 Needle insertion depth: 6 cm Catheter type: closed end flexible Catheter size: 19 Gauge Catheter at skin depth: 10 cm Test dose: negative  Assessment Events: blood not aspirated, injection not painful, no injection resistance, negative IV test and no paresthesia  Additional Notes Patient identified. Risks/Benefits/Options discussed with patient including but not limited to bleeding, infection, nerve damage, paralysis, failed block, incomplete pain control, headache, blood pressure changes, nausea, vomiting, reactions to medication both or allergic, itching and postpartum back pain. Confirmed with bedside nurse the patient's most recent platelet count. Confirmed with patient that they are not currently taking any anticoagulation, have any bleeding history or any family history of bleeding disorders. Patient expressed understanding and wished to proceed. All questions were answered. Sterile technique was used throughout the entire procedure. Please see nursing notes for vital signs. Test dose was given through epidural catheter and negative prior to continuing to dose epidural or start infusion. Warning signs of high block given to the patient including shortness of breath, tingling/numbness in hands, complete motor block, or any concerning symptoms with  instructions to call for help. Patient was given instructions on fall risk and not to get out of bed. All questions and concerns addressed with instructions to call with any issues or inadequate analgesia.

## 2015-01-04 NOTE — Anesthesia Preprocedure Evaluation (Signed)
Anesthesia Evaluation  Patient identified by MRN, date of birth, ID band Patient awake    Reviewed: Allergy & Precautions, NPO status , Patient's Chart, lab work & pertinent test results  History of Anesthesia Complications Negative for: history of anesthetic complications  Airway Mallampati: III  TM Distance: >3 FB Neck ROM: Full    Dental no notable dental hx. (+) Dental Advisory Given   Pulmonary neg pulmonary ROS,  breath sounds clear to auscultation  Pulmonary exam normal       Cardiovascular hypertension (IOL gestational HTN), negative cardio ROS  Rhythm:Regular Rate:Normal     Neuro/Psych  Headaches, negative psych ROS   GI/Hepatic negative GI ROS, Neg liver ROS,   Endo/Other  Hypothyroidism obesity  Renal/GU negative Renal ROS  negative genitourinary   Musculoskeletal negative musculoskeletal ROS (+)   Abdominal   Peds negative pediatric ROS (+)  Hematology negative hematology ROS (+)   Anesthesia Other Findings   Reproductive/Obstetrics (+) Pregnancy TOLAC                             Anesthesia Physical Anesthesia Plan  ASA: II  Anesthesia Plan: Epidural   Post-op Pain Management:    Induction:   Airway Management Planned:   Additional Equipment:   Intra-op Plan:   Post-operative Plan:   Informed Consent: I have reviewed the patients History and Physical, chart, labs and discussed the procedure including the risks, benefits and alternatives for the proposed anesthesia with the patient or authorized representative who has indicated his/her understanding and acceptance.   Dental advisory given  Plan Discussed with:   Anesthesia Plan Comments:         Anesthesia Quick Evaluation

## 2015-01-04 NOTE — Progress Notes (Signed)
Catherine Mckenzie is a 34 y.o. G3P1011 at [redacted]w[redacted]d admitted for induction of labor due to Hypertension.  Subjective: Comfortable with epidural.  Disappointed in progress.  Objective: BP 120/83 mmHg  Pulse 96  Temp(Src) 98.2 F (36.8 C) (Axillary)  Resp 18  Ht 5\' 7"  (1.702 m)  Wt 97.523 kg (215 lb)  BMI 33.67 kg/m2  SpO2 98%  LMP 04/06/2014 Total I/O In: -  Out: 825 [Urine:825]  FHT:  FHR: 135 bpm, variability: moderate,  accelerations:  Present,  decelerations:  Absent UC:   regular  SVE:   Dilation: 5.5 Effacement (%): 90 Station: -2 Exam by:: Lattie Haw, Student CNM  Pitocin @ 6 mu/min  Labs: Lab Results  Component Value Date   WBC 12.1* 01/04/2015   HGB 12.3 01/04/2015   HCT 35.5* 01/04/2015   MCV 92.2 01/04/2015   PLT 250 01/04/2015    Assessment / Plan: Induction of labor due to gestational hypertension,  progressing well on pitocin  Labor: Progressing on Pitocin, will continue to increase then AROM Fetal Wellbeing:  Category I Pain Control:  Epidural Pre-eclampsia: No s/s I/D:  GBS negative Anticipated MOD:  NSVD  Shaylan Tutton Wynne SNM 01/04/2015, 9:44 AM

## 2015-01-04 NOTE — Progress Notes (Signed)
Catherine Mckenzie is a 34 y.o. G3P1011 at [redacted]w[redacted]d  admitted for induction of labor due to Hypertension.  Subjective: Fairly comfortable with epidural, feels like contractions getting stronger and less relaxation between.  Objective: BP 135/78 mmHg  Pulse 75  Temp(Src) 98.5 F (36.9 C) (Oral)  Resp 18  Ht 5\' 7"  (1.702 m)  Wt 215 lb (97.523 kg)  BMI 33.67 kg/m2  SpO2 98%  LMP 04/06/2014      FHT:  FHR: 140 bpm, variability: moderate,  accelerations:  Present,  decelerations:  Absent UC:   regular, every 2-4 minutes SVE:   Dilation: 5 Effacement (%): 70 Station: Ballotable Exam by:: Gaspar Garbe, RN   Labs: Lab Results  Component Value Date   WBC 12.1* 01/04/2015   HGB 12.3 01/04/2015   HCT 35.5* 01/04/2015   MCV 92.2 01/04/2015   PLT 250 01/04/2015    Assessment / Plan: Induction of labor due to gestational hypertension,  progressing well on pitocin  Labor: Progressing on Pitocin, will continue to increase then AROM Preeclampsia:  no signs or symptoms of toxicity Fetal Wellbeing:  Category I Pain Control:  Epidural I/D:  n/a Anticipated MOD:  NSVD  Beverlyn Roux 01/04/2015, 7:24 AM

## 2015-01-04 NOTE — Progress Notes (Signed)
Patient ID: Catherine Mckenzie, female   DOB: [birth year], 34 y.o.   MRN: [id number] Catherine Mckenzie is a 34 y.o. G3P1011 at [redacted]w[redacted]d admitted for induction of labor due to Hypertension.  Subjective: Feeling more comfortable with epidural  Objective: BP 115/54 mmHg  Pulse 76  Temp(Src) 97.8 F (36.6 C) (Oral)  Resp 18  Ht 5\' 7"  (1.702 m)  Wt 215 lb (97.523 kg)  BMI 33.67 kg/m2  SpO2 98%  LMP 04/06/2014     FHT:  FHR: 135 bpm, variability: moderate,  accelerations:  Present,  decelerations:  Absent UC:   irregular, every 4-6 minutes SVE:   Dilation: 5 Effacement (%): 70 Station: Ballotable Exam by:: Adamo, MD   Labs: Lab Results  Component Value Date   WBC 12.1* 01/04/2015   HGB 12.3 01/04/2015   HCT 35.5* 01/04/2015   MCV 92.2 01/04/2015   PLT 250 01/04/2015    Assessment / Plan: Induction of labor due to gestational hypertension, foley bulb out  Labor: Progressing normally, start pitocin Preeclampsia:  no signs or symptoms of toxicity Fetal Wellbeing:  Category I Pain Control:  Epidural I/D:  n/a Anticipated MOD:  NSVD  Beverlyn Roux 01/04/2015, 4:20 AM

## 2015-01-04 NOTE — Progress Notes (Signed)
Catherine Mckenzie is a 34 y.o. G3P1011 at [redacted]w[redacted]d admitted for induction of labor due to Hypertension.  Subjective: Was complaining of pain 8/10 with contractions, but reports more comfortable now since epidural bolus.  Objective: BP 127/68 mmHg  Pulse 84  Temp(Src) 98.2 F (36.8 C) (Axillary)  Resp 18  Ht 5\' 7"  (1.702 m)  Wt 97.523 kg (215 lb)  BMI 33.67 kg/m2  SpO2 98%  LMP 04/06/2014 Total I/O In: -  Out: 825 [Urine:825]  FHT:  FHR: 135 bpm, variability: moderate,  accelerations: Absent,  decelerations:  Present variables UC:   regular, every 2-4 minutes  SVE:   Dilation: 7.5 Effacement (%): 90 Station: 0 Exam by:: Lattie Haw, CNM student  Pitocin @ 2 mu/min  Labs: Lab Results  Component Value Date   WBC 12.1* 01/04/2015   HGB 12.3 01/04/2015   HCT 35.5* 01/04/2015   MCV 92.2 01/04/2015   PLT 250 01/04/2015    Assessment / Plan: Induction of labor due to gestational hypertension,  progressing well on pitocin  Labor: Progressing normally Fetal Wellbeing:  Category II Pain Control:  Epidural Pre-eclampsia: no s/s I/D:  GBS negative Anticipated MOD:  NSVD  Beniah Magnan Wynne SNM 01/04/2015, 12:23 PM

## 2015-01-05 ENCOUNTER — Encounter (HOSPITAL_COMMUNITY): Payer: Self-pay | Admitting: *Deleted

## 2015-01-05 MED ORDER — RHO D IMMUNE GLOBULIN 1500 UNIT/2ML IJ SOSY
300.0000 ug | PREFILLED_SYRINGE | Freq: Once | INTRAMUSCULAR | Status: AC
  Administered 2015-01-05: 300 ug via INTRAMUSCULAR
  Filled 2015-01-05: qty 2

## 2015-01-05 NOTE — Anesthesia Postprocedure Evaluation (Signed)
  Anesthesia Post-op Note  Patient: Catherine Mckenzie  Procedure(s) Performed: * No procedures listed *  Patient Location: PACU and Mother/Baby  Anesthesia Type:Epidural  Level of Consciousness: awake, alert  and oriented  Airway and Oxygen Therapy: Patient Spontanous Breathing  Post-op Pain: mild  Post-op Assessment: Post-op Vital signs reviewed, Patient's Cardiovascular Status Stable, Respiratory Function Stable, No signs of Nausea or vomiting, Adequate PO intake, Pain level controlled, No headache, No backache, No residual numbness and No residual motor weakness  Post-op Vital Signs: Reviewed and stable  Last Vitals:  Filed Vitals:   01/05/15 0544  BP: 111/63  Pulse: 77  Temp: 36.9 C  Resp: 18    Complications: No apparent anesthesia complications

## 2015-01-05 NOTE — Progress Notes (Signed)
Post Partum Day 1 Subjective: no complaints, up ad lib, voiding and tolerating PO  Objective: Blood pressure 111/63, pulse 77, temperature 98.4 F (36.9 C), temperature source Oral, resp. rate 18, height 5\' 7"  (1.702 m), weight 97.523 kg (215 lb), last menstrual period 04/06/2014, SpO2 98 %, unknown if currently breastfeeding.  Physical Exam:  General: alert, cooperative and no distress Lochia: appropriate Uterine Fundus: firm Incision: na DVT Evaluation: No evidence of DVT seen on physical exam. Negative Homan's sign. No cords or calf tenderness. No significant calf/ankle edema.   Recent Labs  01/04/15 0305 01/04/15 1710  HGB 12.3 10.7*  HCT 35.5* 31.8*    Assessment/Plan: Plan for discharge tomorrow, Breastfeeding, Circumcision prior to discharge and Contraception POP   LOS: 2 days   Catherine Mckenzie 01/05/2015, 9:35 AM

## 2015-01-05 NOTE — Progress Notes (Deleted)
Post Partum Day 1 Subjective: no complaints, up ad lib, voiding and tolerating PO  Objective: Blood pressure 111/63, pulse 77, temperature 98.4 F (36.9 C), temperature source Oral, resp. rate 18, height 5\' 7"  (1.702 m), weight 97.523 kg (215 lb), last menstrual period 04/06/2014, SpO2 98 %, unknown if currently breastfeeding.  Physical Exam:  General: alert, cooperative and no distress Lochia: appropriate Uterine Fundus: firm Incision: na DVT Evaluation: No evidence of DVT seen on physical exam. Negative Homan's sign. No cords or calf tenderness. No significant calf/ankle edema.   Recent Labs  01/04/15 0305 01/04/15 1710  HGB 12.3 10.7*  HCT 35.5* 31.8*    Assessment/Plan: Plan for discharge tomorrow, Breastfeeding and Contraception POP's   LOS: 2 days   Catherine Mckenzie 01/05/2015, 11:03 AM

## 2015-01-06 DIAGNOSIS — O34219 Maternal care for unspecified type scar from previous cesarean delivery: Secondary | ICD-10-CM

## 2015-01-06 LAB — RH IG WORKUP (INCLUDES ABO/RH)
ABO/RH(D): O NEG
FETAL SCREEN: NEGATIVE
GESTATIONAL AGE(WKS): 39
Unit division: 0

## 2015-01-06 MED ORDER — DOCUSATE SODIUM 100 MG PO CAPS: 100.0000 mg | ORAL_CAPSULE | Freq: Two times a day (BID) | ORAL | Status: DC | PRN

## 2015-01-06 MED ORDER — NORETHINDRONE 0.35 MG PO TABS: 1.0000 | ORAL_TABLET | Freq: Every day | ORAL | Status: DC

## 2015-01-06 MED ORDER — IBUPROFEN 600 MG PO TABS: 600.0000 mg | ORAL_TABLET | Freq: Four times a day (QID) | ORAL | Status: DC

## 2015-01-06 NOTE — Discharge Summary (Signed)
Obstetric Discharge Summary Reason for Admission: IOL for gestational HTN Prenatal Procedures: ultrasound Intrapartum Procedures: VBAC Postpartum Procedures: none Complications-Operative and Postpartum: none HEMOGLOBIN  Date Value Ref Range Status  01/04/2015 10.7* 12.0 - 15.0 g/dL Final   HCT  Date Value Ref Range Status  01/04/2015 31.8* 36.0 - 46.0 % Final    Physical Exam:  General: alert, cooperative and no distress Lochia: appropriate Uterine Fundus: firm Incision: n/a DVT Evaluation: No evidence of DVT seen on physical exam. Negative Homan's sign. No cords or calf tenderness. No significant calf/ankle edema.  Discharge Diagnoses: Term Pregnancy-delivered  Delivery Note on 01/04/15: At 3:02 PM a viable and healthy female was delivered via Vaginal, Spontaneous Delivery (Presentation: Left Occiput Anterior). APGAR: 8, 9; weight .  Placenta status: Spontaneous, Abnormal, 20% abruption,sent to pathology. Cord: 3 vessels with the following complications: loose nuchal.   Anesthesia: Epidural  Episiotomy: None Lacerations: 3rd degree;Sulcus;Perineal Suture Repair: 3.0 vicryl Est. Blood Loss (mL): 350  Mom to postpartum. Baby to Couplet care / Skin to Skin.  Discharge Information: Date: 01/06/2015 Activity: pelvic rest Diet: routine Medications: Ibuprofen, Colace and Micronor Condition: stable Instructions: refer to practice specific booklet Discharge to: home Follow-up Information    [facility] up with [facility] [place] [facility].   Specialty:  Obstetrics and Gynecology   Why:  In 4-6 weeks for postpartum visit   Contact information:   [place] [phone]      Newborn Data: Live born female  Birth Weight: 6 lb 15.3 oz (3155 g) APGAR: 8, 9  Home with mother.  LEFTWICH-KIRBY, LISA 01/06/2015, 7:54 AM

## 2015-01-06 NOTE — Discharge Instructions (Signed)
Postpartum Care After Vaginal Delivery °After you deliver your newborn (postpartum period), the usual stay in the hospital is 24-72 hours. If there were problems with your labor or delivery, or if you have other medical problems, you might be in the hospital longer.  °While you are in the hospital, you will receive help and instructions on how to care for yourself and your newborn during the postpartum period.  °While you are in the hospital: °· Be sure to tell your nurses if you have pain or discomfort, as well as where you feel the pain and what makes the pain worse. °· If you had an incision made near your vagina (episiotomy) or if you had some tearing during delivery, the nurses may put ice packs on your episiotomy or tear. The ice packs may help to reduce the pain and swelling. °· If you are breastfeeding, you may feel uncomfortable contractions of your uterus for a couple of weeks. This is normal. The contractions help your uterus get back to normal size. °· It is normal to have some bleeding after delivery. °¨ For the first 1-3 days after delivery, the flow is red and the amount may be similar to a period. °¨ It is common for the flow to start and stop. °¨ In the first few days, you may pass some small clots. Let your nurses know if you begin to pass large clots or your flow increases. °¨ Do not  flush blood clots down the toilet before having the nurse look at them. °¨ During the next 3-10 days after delivery, your flow should become more watery and pink or brown-tinged in color. °¨ Ten to fourteen days after delivery, your flow should be a small amount of yellowish-white discharge. °¨ The amount of your flow will decrease over the first few weeks after delivery. Your flow may stop in 6-8 weeks. Most women have had their flow stop by 12 weeks after delivery. °· You should change your sanitary pads frequently. °· Wash your hands thoroughly with soap and water for at least 20 seconds after changing pads, using  the toilet, or before holding or feeding your newborn. °· You should feel like you need to empty your bladder within the first 6-8 hours after delivery. °· In case you become weak, lightheaded, or faint, call your nurse before you get out of bed for the first time and before you take a shower for the first time. °· Within the first few days after delivery, your breasts may begin to feel tender and full. This is called engorgement. Breast tenderness usually goes away within 48-72 hours after engorgement occurs. You may also notice milk leaking from your breasts. If you are not breastfeeding, do not stimulate your breasts. Breast stimulation can make your breasts produce more milk. °· Spending as much time as possible with your newborn is very important. During this time, you and your newborn can feel close and get to know each other. Having your newborn stay in your room (rooming in) will help to strengthen the bond with your newborn.  It will give you time to get to know your newborn and become comfortable caring for your newborn. °· Your hormones change after delivery. Sometimes the hormone changes can temporarily cause you to feel sad or tearful. These feelings should not last more than a few days. If these feelings last longer than that, you should talk to your caregiver. °· If desired, talk to your caregiver about methods of family planning or contraception. °·   Talk to your caregiver about immunizations. Your caregiver may want you to have the following immunizations before leaving the hospital: °¨ Tetanus, diphtheria, and pertussis (Tdap) or tetanus and diphtheria (Td) immunization. It is very important that you and your family (including grandparents) or others caring for your newborn are up-to-date with the Tdap or Td immunizations. The Tdap or Td immunization can help protect your newborn from getting ill. °¨ Rubella immunization. °¨ Varicella (chickenpox) immunization. °¨ Influenza immunization. You should  receive this annual immunization if you did not receive the immunization during your pregnancy. °Document Released: 08/30/2007 Document Revised: 07/27/2012 Document Reviewed: 06/29/2012 °[employer]® Patient Information ©2015 [employer]. This information is not intended to replace advice given to you by your health care provider. Make sure you discuss any questions you have with your health care provider. ° °Breastfeeding Challenges and Solutions °Even though breastfeeding is natural, it can be challenging, especially in the first few weeks after childbirth. It is normal for problems to arise when starting to breastfeed your new baby, even if you have breastfed before. This document provides some solutions to the most common breastfeeding challenges.  °CHALLENGES AND SOLUTIONS °Challenge--Cracked or Sore Nipples °Cracked or sore nipples are commonly experienced by breastfeeding mothers. Cracked or sore nipples often are caused by inadequate latching (when your baby's mouth attaches to your breast to breastfeed). Soreness can also happen if your baby is not positioned properly at your breast. Although nipple cracking and soreness are common during the first week after birth, nipple pain is never normal. If you experience nipple cracking or soreness that lasts longer than 1 week or nipple pain, call your health care provider or lactation consultant.  °Solution °Ensure proper latching and positioning of your baby by following the steps below: °· Find a comfortable place to sit or lie down, with your neck and back well supported. °· Place a pillow or rolled up blanket under your baby to bring him or her to the level of your breast (if you are seated). °· Make sure that your baby's abdomen is facing your abdomen. °· Gently massage your breast. With your fingertips, massage from your chest wall toward your nipple in a circular motion. This encourages milk flow. You may need to continue this action during the feeding if  your milk flows slowly. °· Support your breast with 4 fingers underneath and your thumb above your nipple. Make sure your fingers are well away from your nipple and your baby's mouth. °· Stroke your baby's lips gently with your finger or nipple. °· When your baby's mouth is open wide enough, quickly bring your baby to your breast, placing your entire nipple and as much of the colored area around your nipple (areola) as possible into your baby's mouth. °¨ More areola should be visible above your baby's upper lip than below the lower lip. °¨ Your baby's tongue should be between his or her lower gum and your breast. °· Ensure that your baby's mouth is correctly positioned around your nipple (latched). Your baby's lips should create a seal on your breast and be turned out (everted). °· It is common for your baby to suck for about 2-3 minutes in order to start the flow of breast milk. °Signs that your baby has successfully latched on to your nipple include:  °· Quietly tugging or quietly sucking without causing you pain.   °· Swallowing heard between every 3-4 sucks.   °· Muscle movement above and in front of his or her ears with sucking.   °  Signs that your baby has not successfully latched on to nipple include:  °· Sucking sounds or smacking sounds from your baby while nursing.   °· Nipple pain.   °Ensure that your breasts stay moisturized and healthy by: °· Avoiding the use of soap on your nipples.   °· Wearing a supportive bra. Avoid wearing underwire-style bras or tight bras.   °· Air drying your nipples for 3-4 minutes after each feeding.   °· Using only cotton bra pads to absorb breast milk leakage. Leaking of breast milk between feedings is normal.  Be sure to change the pads if they become soaked with milk. °· Using lanolin on your nipples after nursing. Lanolin helps to maintain your skin's normal moisture barrier. If you use pure lanolin you do not need to wash it off before feeding your baby again. Pure  lanolin is not toxic to your baby.  You may also hand express a few drops of breast milk and gently massage that milk into your nipples, allowing it to air dry. °Challenge--Breast Engorgement °Breast engorgement is the overfilling of your breasts with breast milk. In the first few weeks after giving birth, you may experience breast engorgement. Breast engorgement can make your breasts throb and feel hard, tightly stretched, warm, and tender. Engorgement peaks about the fifth day after you give birth. Having breast engorgement does not mean you have to stop breastfeeding your baby. °Solution °· Breastfeed when you feel the need to reduce the fullness of your breasts or when your baby shows signs of hunger. This is called "breastfeeding on demand." °· Newborns (babies younger than 4 weeks) often breastfeed every 1-3 hours during the day. You may need to awaken your baby to feed if he or she is asleep at a feeding time. °· Do not allow your baby to sleep longer than 5 hours during the night without a feeding. °· Pump or hand express breast milk before breastfeeding to soften your breast, areola, and nipple. °· Apply warm, moist heat (in the shower or with warm water-soaked hand towels) just before feeding or pumping, or massage your breast before or during breastfeeding. This increases circulation and helps your milk to flow. °· Completely empty your breasts when breastfeeding or pumping. Afterward, wear a snug bra (nursing or regular) or tank top for 1-2 days to signal your body to slightly decrease milk production. Only wear snug bras or tank tops to treat engorgement. Tight bras typically should be avoided by breastfeeding mothers. Once engorgement is relieved, return to wearing regular, loose-fitting clothes. °· Apply ice packs to your breasts to lessen the pain from engorgement and relieve swelling, unless the ice is uncomfortable for you. °· Do not delay feedings. Try to relax when it is time to feed your baby.  This helps to trigger your "let-down reflex," which releases milk from your breast. °· Ensure your baby is latched on to your breast and positioned properly while breastfeeding. °· Allow your baby to remain at your breast as long as he or she is latched on well and actively sucking. Your baby will let you know when he or she is done breastfeeding by pulling away from your breast or falling asleep. °· Avoid introducing bottles or pacifiers to your baby in the early weeks of breastfeeding. Wait to introduce these things until after resolving any breastfeeding challenges. °· Try to pump your milk on the same schedule as when your baby would breastfeed if you are returning to work or away from home for an extended period. °· Drink   plenty of fluids to avoid dehydration, which can eventually put you at greater risk of breast engorgement. °If you follow these suggestions, your engorgement should improve in 24-48 hours. If you are still experiencing difficulty, call your lactation consultant or health care provider.  °Challenge--Plugged Milk Ducts °Plugged milk ducts occur when the duct does not drain milk effectively and becomes swollen. Wearing a tight-fitting nursing bra or having difficulty with latching may cause plugged milk ducts. Not drinking enough water (8-10 c [1.9-2.4 L] per day) can contribute to plugged milk ducts. Once a duct has become plugged, hard lumps, soreness, and redness may develop in your breast.  °Solution °Do not delay feedings. Feed your baby frequently and try to empty your breasts of milk at each feeding. Try breastfeeding from the affected side first so there is a better chance that the milk will drain completely from that breast. Apply warm, moist towels to your breasts for 5-10 minutes before feeding. Alternatively, a hot shower right before breastfeeding can provide the moist heat that can encourage milk flow. Gentle massage of the sore area before and during a feeding may also help. Avoid  wearing tight clothing or bras that put pressure on your breasts. Wear bras that offer good support to your breasts, but avoid underwire bras. If you have a plugged milk duct and develop a fever, you need to see your health care provider.  °Challenge--Mastitis °Mastitis is inflammation of your breast. It usually is caused by a bacterial infection and can cause flu-like symptoms. You may develop redness in your breast and a fever. Often when mastitis occurs, your breast becomes firm, warm, and very painful. The most common causes of mastitis are poor latching, ineffective sucking from your baby, consistent pressure on your breast (possibly from wearing a tight-fitting bra or shirt that restricts the milk flow), unusual stress or fatigue, or missed feedings.  °Solution °You will be given antibiotic medicine to treat the infection. It is still important to breastfeed frequently to empty your breasts. Continuing to breastfeed while you recover from mastitis will not harm your baby. Make sure your baby is positioned properly during every feeding. Apply moist heat to your breasts for a few minutes before feeding to help the milk flow and to help your breasts empty more easily. °Challenge--Thrush °Thrush is a yeast infection that can form on your nipples, in your breast, or in your baby's mouth. It causes itching, soreness, burning or stabbing pain, and sometimes a rash.  °Solution °You will be given a medicated ointment for your nipples, and your baby will be given a liquid medicine for his or her mouth. It is important that you and your baby are treated at the same time because thrush can be passed between you and your baby. Change disposable nursing pads often. Any bras, towels, or clothing that come in contact with infected areas of your body or your baby's body need to be washed in very hot water every day. Wash your hands and your baby's hands often. All pacifiers, bottle nipples, or toys your baby puts in his or her  mouth should be boiled once a day for 20 minutes. After 1 week of treatment, discard pacifiers and bottle nipples and buy new ones. All breast pump parts that touch the milk need to be boiled for 20 minutes every day. °Challenge--Low Milk Supply °You may not be producing enough milk if your baby is not gaining the proper amount of weight. Breast milk production is based on a   supply-and-demand system. Your milk supply depends on how frequently and effectively your baby empties your breast.  °Solution °The more you breastfeed and pump, the more breast milk you will produce. It is important that your baby empties at least one of your breasts at each feeding. If this is not happening, then use a breast pump or hand express any milk that remains. This will help to drain as much milk as possible at each feeding. It will also signal your body to produce more milk. If your baby is not emptying your breasts, it may be due to latching, sucking, or positioning problems. If low milk supply continues after addressing these issues, contact your health care provider or a lactation specialist as soon as possible. °Challenge--Inverted or Flat Nipples °Some women have nipples that turn inward instead of protruding outward. Other women have nipples that are flat. Inverted or flat nipples can sometimes make it more difficult for your baby to latch onto your breast. °Solution °You may be given a small device that pulls out inverted nipples. This device should be applied right before your baby is brought to your breast. You can also try using a breast pump for a short time before placing the baby at your breast. The pump can pull your nipple outwards to help your infant latch more easily. The baby's sucking motion will help the inverted nipple protrude as well.  °If you have flat nipples, encourage your baby to latch onto your breast and feed frequently in the early days after birth. This will give your baby practice latching on  correctly while your breast is still soft. When your milk supply increases, between the second and fifth day after birth and your breasts become full, your baby will have an easier time latching.  °Contact a lactation consultant if you still have concerns. She or he can teach you additional techniques to address breastfeeding problems related to nipple shape and position.  °FOR MORE INFORMATION °[employer]: www.llli.org °Document Released: 04/26/2006 Document Revised: 11/07/2013 Document Reviewed: 04/28/2013 °[employer]® Patient Information ©2015 [employer]. This information is not intended to replace advice given to you by your health care provider. Make sure you discuss any questions you have with your health care provider. ° °

## 2015-01-06 NOTE — Lactation Note (Signed)
This note was copied from the chart of [facility]. Lactation Consultation Note: experienced BF mom reports that baby fed a lot through the night. Reassurance given. Baby for circ this morning before DC. No questions at present. To call prn   Patient Name: Catherine Mckenzie [id number] Date: 01/06/2015 Reason for consult: Follow-up assessment   Maternal Data Formula Feeding for Exclusion: No Does the patient have breastfeeding experience prior to this delivery?: Yes  Feeding   LATCH Score/Interventions                      Lactation Tools Discussed/Used     Consult Status Consult Status: Complete    Truddie Crumble 01/06/2015, 8:27 AM

## 2015-02-01 ENCOUNTER — Other Ambulatory Visit: Payer: Self-pay | Admitting: Family Medicine

## 2015-02-20 ENCOUNTER — Encounter: Payer: Self-pay | Admitting: Family Medicine

## 2015-02-20 ENCOUNTER — Ambulatory Visit (INDEPENDENT_AMBULATORY_CARE_PROVIDER_SITE_OTHER): Payer: BLUE CROSS/BLUE SHIELD | Admitting: Family Medicine

## 2015-02-20 DIAGNOSIS — Z1151 Encounter for screening for human papillomavirus (HPV): Secondary | ICD-10-CM | POA: Diagnosis not present

## 2015-02-20 DIAGNOSIS — E034 Atrophy of thyroid (acquired): Secondary | ICD-10-CM

## 2015-02-20 DIAGNOSIS — E038 Other specified hypothyroidism: Secondary | ICD-10-CM

## 2015-02-20 DIAGNOSIS — Z124 Encounter for screening for malignant neoplasm of cervix: Secondary | ICD-10-CM | POA: Diagnosis not present

## 2015-02-20 DIAGNOSIS — Z30011 Encounter for initial prescription of contraceptive pills: Secondary | ICD-10-CM

## 2015-02-20 NOTE — Progress Notes (Signed)
  Subjective:     Catherine Mckenzie is a 34 y.o. female who presents for a postpartum visit. She is 6 weeks postpartum following a spontaneous vaginal delivery. I have fully reviewed the prenatal and intrapartum course. The delivery was at 4 gestational weeks. Outcome: vaginal birth after cesarean (VBAC). Anesthesia: epidural. Postpartum course has been normal. Baby's course has been normal. Baby is feeding by breast. Bleeding no bleeding. Bowel function is normal. Bladder function is normal. Patient is not sexually active. Contraception method is oral progesterone-only contraceptive. Postpartum depression screening: negative.  The following portions of the patient's history were reviewed and updated as appropriate: allergies, current medications, past family history, past medical history, past social history, past surgical history and problem list.  Review of Systems Pertinent items are noted in HPI.   Objective:    BP 117/79 mmHg  Pulse 84  Wt 200 lb (90.719 kg)  Breastfeeding? Yes  General:  alert, cooperative and appears stated age   Vulva:  normal  Vagina: normal vagina  Cervix:  no bleeding following Pap and no cervical motion tenderness  Corpus: normal size, contour, position, consistency, mobility, non-tender  Adnexa:  normal adnexa        Assessment:     Normal postpartum exam. Pap smear done at today's visit.   Plan:    1. Contraception: oral progesterone-only contraceptive 2. TSH re-check due to pp state and weight loss 3. Follow up in: 1 year or as needed.

## 2015-02-20 NOTE — Patient Instructions (Signed)
Contraception Choices Contraception (birth control) is the use of any methods or devices to prevent pregnancy. Below are some methods to help avoid pregnancy. HORMONAL METHODS   Contraceptive implant. This is a thin, plastic tube containing progesterone hormone. It does not contain estrogen hormone. Your health care provider inserts the tube in the inner part of the upper arm. The tube can remain in place for up to 3 years. After 3 years, the implant must be removed. The implant prevents the ovaries from releasing an egg (ovulation), thickens the cervical mucus to prevent sperm from entering the uterus, and thins the lining of the inside of the uterus.  Progesterone-only injections. These injections are given every 3 months by your health care provider to prevent pregnancy. This synthetic progesterone hormone stops the ovaries from releasing eggs. It also thickens cervical mucus and changes the uterine lining. This makes it harder for sperm to survive in the uterus.  Birth control pills. These pills contain estrogen and progesterone hormone. They work by preventing the ovaries from releasing eggs (ovulation). They also cause the cervical mucus to thicken, preventing the sperm from entering the uterus. Birth control pills are prescribed by a health care provider.Birth control pills can also be used to treat heavy periods.  Minipill. This type of birth control pill contains only the progesterone hormone. They are taken every day of each month and must be prescribed by your health care provider.  Birth control patch. The patch contains hormones similar to those in birth control pills. It must be changed once a week and is prescribed by a health care provider.  Vaginal ring. The ring contains hormones similar to those in birth control pills. It is left in the vagina for 3 weeks, removed for 1 week, and then a new one is put back in place. The patient must be comfortable inserting and removing the ring  from the vagina.A health care provider's prescription is necessary.  Emergency contraception. Emergency contraceptives prevent pregnancy after unprotected sexual intercourse. This pill can be taken right after sex or up to 5 days after unprotected sex. It is most effective the sooner you take the pills after having sexual intercourse. Most emergency contraceptive pills are available without a prescription. Check with your pharmacist. Do not use emergency contraception as your only form of birth control. BARRIER METHODS   Female condom. This is a thin sheath (latex or rubber) that is worn over the penis during sexual intercourse. It can be used with spermicide to increase effectiveness.  Female condom. This is a soft, loose-fitting sheath that is put into the vagina before sexual intercourse.  Diaphragm. This is a soft, latex, dome-shaped barrier that must be fitted by a health care provider. It is inserted into the vagina, along with a spermicidal jelly. It is inserted before intercourse. The diaphragm should be left in the vagina for 6 to 8 hours after intercourse.  Cervical cap. This is a round, soft, latex or plastic cup that fits over the cervix and must be fitted by a health care provider. The cap can be left in place for up to 48 hours after intercourse.  Sponge. This is a soft, circular piece of polyurethane foam. The sponge has spermicide in it. It is inserted into the vagina after wetting it and before sexual intercourse.  Spermicides. These are chemicals that kill or block sperm from entering the cervix and uterus. They come in the form of creams, jellies, suppositories, foam, or tablets. They do not require a   prescription. They are inserted into the vagina with an applicator before having sexual intercourse. The process must be repeated every time you have sexual intercourse. INTRAUTERINE CONTRACEPTION  Intrauterine device (IUD). This is a T-shaped device that is put in a woman's uterus  during a menstrual period to prevent pregnancy. There are 2 types:  Copper IUD. This type of IUD is wrapped in copper wire and is placed inside the uterus. Copper makes the uterus and fallopian tubes produce a fluid that kills sperm. It can stay in place for 10 years.  Hormone IUD. This type of IUD contains the hormone progestin (synthetic progesterone). The hormone thickens the cervical mucus and prevents sperm from entering the uterus, and it also thins the uterine lining to prevent implantation of a fertilized egg. The hormone can weaken or kill the sperm that get into the uterus. It can stay in place for 3-5 years, depending on which type of IUD is used. PERMANENT METHODS OF CONTRACEPTION  Female tubal ligation. This is when the woman's fallopian tubes are surgically sealed, tied, or blocked to prevent the egg from traveling to the uterus.  Hysteroscopic sterilization. This involves placing a small coil or insert into each fallopian tube. Your doctor uses a technique called hysteroscopy to do the procedure. The device causes scar tissue to form. This results in permanent blockage of the fallopian tubes, so the sperm cannot fertilize the egg. It takes about 3 months after the procedure for the tubes to become blocked. You must use another form of birth control for these 3 months.  Female sterilization. This is when the female has the tubes that carry sperm tied off (vasectomy).This blocks sperm from entering the vagina during sexual intercourse. After the procedure, the man can still ejaculate fluid (semen). NATURAL PLANNING METHODS  Natural family planning. This is not having sexual intercourse or using a barrier method (condom, diaphragm, cervical cap) on days the woman could become pregnant.  Calendar method. This is keeping track of the length of each menstrual cycle and identifying when you are fertile.  Ovulation method. This is avoiding sexual intercourse during ovulation.  Symptothermal  method. This is avoiding sexual intercourse during ovulation, using a thermometer and ovulation symptoms.  Post-ovulation method. This is timing sexual intercourse after you have ovulated. Regardless of which type or method of contraception you choose, it is important that you use condoms to protect against the transmission of sexually transmitted infections (STIs). Talk with your health care provider about which form of contraception is most appropriate for you. Document Released: 11/02/2005 Document Revised: 11/07/2013 Document Reviewed: 04/27/2013 [employer] Patient Information 2015 [employer]. This information is not intended to replace advice given to you by your health care provider. Make sure you discuss any questions you have with your health care provider.  

## 2015-02-21 LAB — TSH: TSH: 0.998 u[IU]/mL (ref 0.350–4.500)

## 2015-02-21 LAB — CYTOLOGY - PAP

## 2015-04-04 ENCOUNTER — Other Ambulatory Visit: Payer: Self-pay | Admitting: Family Medicine

## 2015-05-13 ENCOUNTER — Other Ambulatory Visit: Payer: Self-pay

## 2015-05-19 ENCOUNTER — Emergency Department (HOSPITAL_BASED_OUTPATIENT_CLINIC_OR_DEPARTMENT_OTHER)
Admission: EM | Admit: 2015-05-19 | Discharge: 2015-05-19 | Disposition: A | Discharge status: Home / Self Care | Payer: BLUE CROSS/BLUE SHIELD | Attending: Emergency Medicine | Admitting: Emergency Medicine

## 2015-05-19 ENCOUNTER — Encounter (HOSPITAL_BASED_OUTPATIENT_CLINIC_OR_DEPARTMENT_OTHER): Payer: Self-pay | Admitting: *Deleted

## 2015-05-19 ENCOUNTER — Emergency Department (HOSPITAL_BASED_OUTPATIENT_CLINIC_OR_DEPARTMENT_OTHER): Payer: BLUE CROSS/BLUE SHIELD

## 2015-05-19 DIAGNOSIS — Z9889 Other specified postprocedural states: Secondary | ICD-10-CM | POA: Diagnosis not present

## 2015-05-19 DIAGNOSIS — R143 Flatulence: Secondary | ICD-10-CM | POA: Diagnosis not present

## 2015-05-19 DIAGNOSIS — Z793 Long term (current) use of hormonal contraceptives: Secondary | ICD-10-CM | POA: Insufficient documentation

## 2015-05-19 DIAGNOSIS — M549 Dorsalgia, unspecified: Secondary | ICD-10-CM | POA: Diagnosis not present

## 2015-05-19 DIAGNOSIS — R52 Pain, unspecified: Secondary | ICD-10-CM

## 2015-05-19 DIAGNOSIS — Z79899 Other long term (current) drug therapy: Secondary | ICD-10-CM | POA: Diagnosis not present

## 2015-05-19 DIAGNOSIS — Z8679 Personal history of other diseases of the circulatory system: Secondary | ICD-10-CM | POA: Diagnosis not present

## 2015-05-19 DIAGNOSIS — E079 Disorder of thyroid, unspecified: Secondary | ICD-10-CM | POA: Diagnosis not present

## 2015-05-19 DIAGNOSIS — Z86018 Personal history of other benign neoplasm: Secondary | ICD-10-CM | POA: Insufficient documentation

## 2015-05-19 DIAGNOSIS — R109 Unspecified abdominal pain: Secondary | ICD-10-CM | POA: Diagnosis present

## 2015-05-19 DIAGNOSIS — Z3202 Encounter for pregnancy test, result negative: Secondary | ICD-10-CM | POA: Diagnosis not present

## 2015-05-19 LAB — COMPREHENSIVE METABOLIC PANEL
ALK PHOS: 84 U/L (ref 38–126)
ALT: 13 U/L — AB (ref 14–54)
AST: 19 U/L (ref 15–41)
Albumin: 4 g/dL (ref 3.5–5.0)
Anion gap: 11 (ref 5–15)
BUN: 20 mg/dL (ref 6–20)
CALCIUM: 9.2 mg/dL (ref 8.9–10.3)
CO2: 24 mmol/L (ref 22–32)
Chloride: 104 mmol/L (ref 101–111)
Creatinine, Ser: 0.78 mg/dL (ref 0.44–1.00)
GFR calc Af Amer: >60 mL/min (ref >60)
Glucose, Bld: 124 mg/dL — ABNORMAL HIGH (ref 65–99)
POTASSIUM: 3.4 mmol/L — AB (ref 3.5–5.1)
Sodium: 139 mmol/L (ref 135–145)
Total Bilirubin: 0.5 mg/dL (ref 0.3–1.2)
Total Protein: 7.7 g/dL (ref 6.5–8.1)

## 2015-05-19 LAB — CBC WITH DIFFERENTIAL/PLATELET
BASOS PCT: 0 % (ref 0–1)
Basophils Absolute: 0 10*3/uL (ref 0.0–0.1)
Eosinophils Absolute: 0.2 10*3/uL (ref 0.0–0.7)
Eosinophils Relative: 2 % (ref 0–5)
HCT: 39 % (ref 36.0–46.0)
Hemoglobin: 12.8 g/dL (ref 12.0–15.0)
Lymphocytes Relative: 28 % (ref 12–46)
Lymphs Abs: 3 10*3/uL (ref 0.7–4.0)
MCH: 26.9 pg (ref 26.0–34.0)
MCHC: 32.8 g/dL (ref 30.0–36.0)
MCV: 82.1 fL (ref 78.0–100.0)
MONOS PCT: 6 % (ref 3–12)
Monocytes Absolute: 0.6 10*3/uL (ref 0.1–1.0)
NEUTROS PCT: 64 % (ref 43–77)
Neutro Abs: 6.9 10*3/uL (ref 1.7–7.7)
Platelets: 253 10*3/uL (ref 150–400)
RBC: 4.75 MIL/uL (ref 3.87–5.11)
RDW: 19.6 % — AB (ref 11.5–15.5)
WBC: 10.6 10*3/uL — AB (ref 4.0–10.5)

## 2015-05-19 LAB — URINALYSIS, ROUTINE W REFLEX MICROSCOPIC
Bilirubin Urine: NEGATIVE
Glucose, UA: NEGATIVE mg/dL
Hgb urine dipstick: NEGATIVE
KETONES UR: 15 mg/dL — AB
LEUKOCYTES UA: NEGATIVE
Nitrite: NEGATIVE
PROTEIN: NEGATIVE mg/dL
Specific Gravity, Urine: 1.029 (ref 1.005–1.030)
UROBILINOGEN UA: 0.2 mg/dL (ref 0.0–1.0)
pH: 6 (ref 5.0–8.0)

## 2015-05-19 LAB — PREGNANCY, URINE: Preg Test, Ur: NEGATIVE

## 2015-05-19 LAB — LIPASE, BLOOD: Lipase: 22 U/L (ref 22–51)

## 2015-05-19 MED ORDER — TRAMADOL HCL 50 MG PO TABS: 50.0000 mg | ORAL_TABLET | Freq: Four times a day (QID) | ORAL | Status: DC | PRN

## 2015-05-19 MED ORDER — DICYCLOMINE HCL 10 MG/ML IM SOLN
20.0000 mg | Freq: Once | INTRAMUSCULAR | Status: AC
  Administered 2015-05-19: 20 mg via INTRAMUSCULAR
  Filled 2015-05-19: qty 2

## 2015-05-19 MED ORDER — MORPHINE SULFATE 4 MG/ML IJ SOLN
4.0000 mg | Freq: Once | INTRAMUSCULAR | Status: AC
Start: 2015-05-19 — End: 2015-05-19
  Administered 2015-05-19: 4 mg via INTRAVENOUS
  Filled 2015-05-19: qty 1

## 2015-05-19 MED ORDER — ONDANSETRON HCL 4 MG/2ML IJ SOLN
4.0000 mg | Freq: Once | INTRAMUSCULAR | Status: AC
  Administered 2015-05-19: 4 mg via INTRAVENOUS
  Filled 2015-05-19: qty 2

## 2015-05-19 MED ORDER — FENTANYL CITRATE (PF) 100 MCG/2ML IJ SOLN
100.0000 ug | Freq: Once | INTRAMUSCULAR | Status: AC
  Administered 2015-05-19: 100 ug via INTRAVENOUS
  Filled 2015-05-19: qty 2

## 2015-05-19 MED ORDER — KETOROLAC TROMETHAMINE 30 MG/ML IJ SOLN
30.0000 mg | Freq: Once | INTRAMUSCULAR | Status: AC
  Administered 2015-05-19: 30 mg via INTRAVENOUS
  Filled 2015-05-19: qty 1

## 2015-05-19 NOTE — ED Notes (Addendum)
C/o RUQ and R flank pain, going back and forth b/w the two, h/o similar, last episode 4 months ago, still has gall bladder, "family h/o gall stones", pt restless, rates pain 10/10, also reports nausea, intermittent diarhea for two weeks (denies: fever, bleeding or vomiting), last ate 2000, last BM PTA. Currently breast feeding. Pt alert, NAD, calm, interactive, resps e/u, speaking in clear complete sentences, steady gait. sister with pt.

## 2015-05-19 NOTE — ED Provider Notes (Signed)
CSN: [id number]     Arrival date & time 05/19/15  0238 History   First MD Initiated Contact with Patient 05/19/15 0301     Chief Complaint  Patient presents with  . Back Pain  . Abdominal Pain     (Consider location/radiation/quality/duration/timing/severity/associated sxs/prior Treatment) Patient is a 34 y.o. female presenting with flank pain. The history is provided by the patient.  Flank Pain This is a new problem. The current episode started 1 to 2 hours ago. The problem occurs constantly. The problem has not changed since onset.Pertinent negatives include no chest pain, no headaches and no shortness of breath. Nothing aggravates the symptoms. Nothing relieves the symptoms. She has tried nothing for the symptoms. The treatment provided no relief.  No pain in the chest no shortness of breath.  States she ate kale and chicken several hours ago.  Denied vomiting or diarrhea.  Has had loose stools that are a normal color.    Past Medical History  Diagnosis Date  . Fibroadenoma     left breast  . Thyroid disease   . Allergy   . Migraine headache without aura   . FIBROADENOMA, BREAST 04/27/2007    Qualifier: Diagnosis of  By: Fuller Plan CMA (AAMA), Terri Skains     Past Surgical History  Procedure Laterality Date  . Cesarean section    . Wisdom tooth extraction      x4   Family History  Problem Relation Age of Onset  . Diabetes Mother   . Hypertension Mother   . Hyperlipidemia Mother   . Stroke Mother     TIA  . Asthma Sister   . Depression Sister   . Cancer Maternal Grandmother 50    PANCREATIC  . Heart disease Maternal Grandfather   . Hypertension Maternal Grandfather   . Hypertension Paternal Grandmother   . Cancer Paternal Grandmother     BREAST  . Stroke Paternal Grandmother   . Cancer Paternal Grandfather     PROSTATE  . Seizures Paternal Grandfather   . Hypertension Paternal Grandfather   . Stroke Paternal Grandfather    History  Substance Use Topics  . Smoking  status: Never Smoker   . Smokeless tobacco: Never Used  . Alcohol Use: Yes     Comment: rarely   OB History    Gravida Para Term Preterm AB TAB SAB Ectopic Multiple Living   3 2 2  1  1   0 1     Review of Systems  Constitutional: Negative for fever.  Respiratory: Negative for shortness of breath.   Cardiovascular: Negative for chest pain.  Gastrointestinal: Negative for nausea, vomiting, diarrhea and constipation.  Genitourinary: Positive for flank pain. Negative for dysuria.  Neurological: Negative for headaches.  All other systems reviewed and are negative.     Allergies  Levaquin; Moxifloxacin; Soy allergy; Wheat bran; Benzocaine; and Benzoyl peroxide-erythromycin  Home Medications   Prior to Admission medications   Medication Sig Start Date End Date Taking? Authorizing Provider  acetaminophen (TYLENOL) 500 MG tablet Take 1,000 mg by mouth every 6 (six) hours as needed. For migraine    Historical Provider, MD  ARMOUR THYROID 60 MG tablet TAKE 2 TABLETS (120 MG TOTAL) BY MOUTH DAILY BEFORE BREAKFAST. 02/01/15   Donnamae Jude, MD  ARMOUR THYROID 60 MG tablet TAKE 2 TABLETS (120 MG TOTAL) BY MOUTH DAILY BEFORE BREAKFAST. 04/04/15   Donnamae Jude, MD  cetirizine (ZYRTEC) 10 MG tablet Take 10 mg by mouth daily.  Historical Provider, MD  docusate sodium (COLACE) 100 MG capsule Take 1 capsule (100 mg total) by mouth 2 (two) times daily as needed. 01/06/15   Lisa A Leftwich-Kirby, CNM  EPIPEN 2-PAK 0.3 MG/0.3ML SOAJ Inject 0.03 mg into the muscle as needed. 03/01/13   Historical Provider, MD  ergocalciferol (DRISDOL) 8000 UNIT/ML drops Take 4,000 Units by mouth daily.     Historical Provider, MD  ibuprofen (ADVIL,MOTRIN) 600 MG tablet Take 1 tablet (600 mg total) by mouth every 6 (six) hours. 01/06/15   Kathie Dike Leftwich-Kirby, CNM  NONFORMULARY OR COMPOUNDED ITEM Take 1 capsule by mouth daily. MMB 5mg /2.5mg /50 mg    Historical Provider, MD  norethindrone (MICRONOR,CAMILA,ERRIN) 0.35 MG  tablet Take 1 tablet (0.35 mg total) by mouth daily. 01/06/15   Elvera Maria, CNM  Prenatal Vit-Fe Fumarate-FA (PRENATAL MULTIVITAMIN) TABS tablet Take 1 tablet by mouth daily at 12 noon.    Historical Provider, MD  terbinafine (LAMISIL) 1 % cream Apply 1 application topically daily.    Historical Provider, MD  Tuberculin-Allergy Syringes (ALLERGY SYRINGE 1CC/27GX1/2") 27G X 1/2" 1 ML MISC 1 each by Does not apply route once a week. Pt stated that she gets allergy injections weekly    Historical Provider, MD   BP 123/69 mmHg  Pulse 66  Temp(Src) 97.3 F (36.3 C) (Oral)  Resp 20  Ht 5\' 7"  (1.702 m)  Wt 208 lb (94.348 kg)  BMI 32.57 kg/m2  SpO2 96% Physical Exam  Constitutional: She is oriented to person, place, and time. She appears well-developed and well-nourished. No distress.  HENT:  Head: Normocephalic and atraumatic.  Mouth/Throat: Oropharynx is clear and moist.  Eyes: Conjunctivae are normal. Pupils are equal, round, and reactive to light.  Neck: Normal range of motion. Neck supple.  Cardiovascular: Normal rate, regular rhythm and intact distal pulses.   Pulmonary/Chest: Effort normal and breath sounds normal. No respiratory distress. She has no wheezes. She has no rales.  Abdominal: Soft. Bowel sounds are increased. There is no tenderness. There is no rebound, no guarding, no tenderness at McBurney's point and negative Murphy's sign.  Gassy throughout  Musculoskeletal: Normal range of motion.  Neurological: She is alert and oriented to person, place, and time.  Skin: Skin is warm and dry.  Psychiatric: She has a normal mood and affect.    ED Course  Procedures (including critical care time) Labs Review Labs Reviewed  CBC WITH DIFFERENTIAL/PLATELET - Abnormal; Notable for the following:    WBC 10.6 (*)    RDW 19.6 (*)    All other components within normal limits  COMPREHENSIVE METABOLIC PANEL - Abnormal; Notable for the following:    Potassium 3.4 (*)     Glucose, Bld 124 (*)    ALT 13 (*)    All other components within normal limits  URINALYSIS, ROUTINE W REFLEX MICROSCOPIC (NOT AT [facility]) - Abnormal; Notable for the following:    Ketones, ur 15 (*)    All other components within normal limits  LIPASE, BLOOD  PREGNANCY, URINE    Imaging Review No results found.   EKG Interpretation None      MDM   Final diagnoses:  Pain    Results for orders placed or performed during the hospital encounter of 05/19/15  CBC with Differential  Result Value Ref Range   WBC 10.6 (H) 4.0 - 10.5 K/uL   RBC 4.75 3.87 - 5.11 MIL/uL   Hemoglobin 12.8 12.0 - 15.0 g/dL   HCT 39.0 36.0 -  46.0 %   MCV 82.1 78.0 - 100.0 fL   MCH 26.9 26.0 - 34.0 pg   MCHC 32.8 30.0 - 36.0 g/dL   RDW 19.6 (H) 11.5 - 15.5 %   Platelets 253 150 - 400 K/uL   Neutrophils Relative % 64 43 - 77 %   Neutro Abs 6.9 1.7 - 7.7 K/uL   Lymphocytes Relative 28 12 - 46 %   Lymphs Abs 3.0 0.7 - 4.0 K/uL   Monocytes Relative 6 3 - 12 %   Monocytes Absolute 0.6 0.1 - 1.0 K/uL   Eosinophils Relative 2 0 - 5 %   Eosinophils Absolute 0.2 0.0 - 0.7 K/uL   Basophils Relative 0 0 - 1 %   Basophils Absolute 0.0 0.0 - 0.1 K/uL  Comprehensive metabolic panel  Result Value Ref Range   Sodium 139 135 - 145 mmol/L   Potassium 3.4 (L) 3.5 - 5.1 mmol/L   Chloride 104 101 - 111 mmol/L   CO2 24 22 - 32 mmol/L   Glucose, Bld 124 (H) 65 - 99 mg/dL   BUN 20 6 - 20 mg/dL   Creatinine, Ser 0.78 0.44 - 1.00 mg/dL   Calcium 9.2 8.9 - 10.3 mg/dL   Total Protein 7.7 6.5 - 8.1 g/dL   Albumin 4.0 3.5 - 5.0 g/dL   AST 19 15 - 41 U/L   ALT 13 (L) 14 - 54 U/L   Alkaline Phosphatase 84 38 - 126 U/L   Total Bilirubin 0.5 0.3 - 1.2 mg/dL   GFR calc non Af Amer >60 >60 mL/min   GFR calc Af Amer >60 >60 mL/min   Anion gap 11 5 - 15  Lipase, blood  Result Value Ref Range   Lipase 22 22 - 51 U/L  Urinalysis, Routine w reflex microscopic (not at [facility])  Result Value Ref Range   Color, Urine YELLOW  YELLOW   APPearance CLEAR CLEAR   Specific Gravity, Urine 1.029 1.005 - 1.030   pH 6.0 5.0 - 8.0   Glucose, UA NEGATIVE NEGATIVE mg/dL   Hgb urine dipstick NEGATIVE NEGATIVE   Bilirubin Urine NEGATIVE NEGATIVE   Ketones, ur 15 (A) NEGATIVE mg/dL   Protein, ur NEGATIVE NEGATIVE mg/dL   Urobilinogen, UA 0.2 0.0 - 1.0 mg/dL   Nitrite NEGATIVE NEGATIVE   Leukocytes, UA NEGATIVE NEGATIVE  Pregnancy, urine  Result Value Ref Range   Preg Test, Ur NEGATIVE NEGATIVE   Ct Renal Stone Study  05/19/2015   CLINICAL DATA:  Right upper quadrant and right flank pain.  EXAM: CT ABDOMEN AND PELVIS WITHOUT CONTRAST  TECHNIQUE: Multidetector CT imaging of the abdomen and pelvis was performed following the standard protocol without IV contrast.  COMPARISON:  None.  FINDINGS: There is calcification of a portion of the gallbladder wall. No gallbladder calculi are evident. There is no bile duct dilatation. There are unremarkable unenhanced appearances of the liver, spleen, pancreas, adrenals and kidneys. There is no urinary calculus. There is no hydronephrosis or ureteral dilatation.  The abdominal aorta is normal in caliber. There is no atherosclerotic calcification. There is no adenopathy in the abdomen or pelvis.  There are unremarkable appearances of the stomach, small bowel and colon. The uterus and ovaries appear unremarkable.  There are no acute inflammatory changes in the abdomen or pelvis. Lung bases are clear. There is no significant musculoskeletal abnormality.  IMPRESSION: Calcification of a portion of the gallbladder wall (porcelain gallbladder).  No acute findings are evident in the abdomen  or pelvis.   Electronically Signed   By: Andreas Newport M.D.   On: 05/19/2015 05:25    Medications  fentaNYL (SUBLIMAZE) injection 100 mcg (100 mcg Intravenous Given 05/19/15 0326)  dicyclomine (BENTYL) injection 20 mg (20 mg Intramuscular Given 05/19/15 0409)  ketorolac (TORADOL) 30 MG/ML injection 30 mg (30 mg  Intravenous Given 05/19/15 0405)  ondansetron (ZOFRAN) injection 4 mg (4 mg Intravenous Given 05/19/15 0405)  morphine 4 MG/ML injection 4 mg (4 mg Intravenous Given 05/19/15 0513)   No murphy's sign on exam.  No pain with palpation of the abdomen.  LFTs and lipase are normal.  The gall bladder is not thickened.  Patient is gassy on exam.  This likely is spasm.    Follow up with general surgery to discuss calcification in the gall bladder wall.  Bland diet.  Close follow up with your PMD.  Pump and disgard breast milk x 24 hours.  Patient verbalizes understanding and agrees to follow up    Shafin Pollio, MD 05/19/15 [id number]

## 2015-05-19 NOTE — ED Notes (Signed)
Catherine Mckenzie is currently pumping and dumping breast milk prior to discharge home she reports its past time for pumping breast milk

## 2015-05-22 ENCOUNTER — Encounter (HOSPITAL_COMMUNITY): Payer: Self-pay

## 2015-05-22 ENCOUNTER — Emergency Department (HOSPITAL_COMMUNITY): Payer: BLUE CROSS/BLUE SHIELD

## 2015-05-22 ENCOUNTER — Observation Stay (HOSPITAL_COMMUNITY)
Admission: EM | Admit: 2015-05-22 | Discharge: 2015-05-25 | Disposition: A | Discharge status: Home / Self Care | Payer: BLUE CROSS/BLUE SHIELD | Attending: General Surgery | Admitting: General Surgery

## 2015-05-22 DIAGNOSIS — K8065 Calculus of gallbladder and bile duct with chronic cholecystitis with obstruction: Secondary | ICD-10-CM | POA: Diagnosis not present

## 2015-05-22 DIAGNOSIS — Z793 Long term (current) use of hormonal contraceptives: Secondary | ICD-10-CM | POA: Diagnosis not present

## 2015-05-22 DIAGNOSIS — Z791 Long term (current) use of non-steroidal anti-inflammatories (NSAID): Secondary | ICD-10-CM | POA: Insufficient documentation

## 2015-05-22 DIAGNOSIS — E039 Hypothyroidism, unspecified: Secondary | ICD-10-CM | POA: Diagnosis not present

## 2015-05-22 DIAGNOSIS — Z8379 Family history of other diseases of the digestive system: Secondary | ICD-10-CM | POA: Insufficient documentation

## 2015-05-22 DIAGNOSIS — G43009 Migraine without aura, not intractable, without status migrainosus: Secondary | ICD-10-CM | POA: Diagnosis not present

## 2015-05-22 DIAGNOSIS — K8020 Calculus of gallbladder without cholecystitis without obstruction: Secondary | ICD-10-CM | POA: Diagnosis present

## 2015-05-22 DIAGNOSIS — Z79899 Other long term (current) drug therapy: Secondary | ICD-10-CM | POA: Diagnosis not present

## 2015-05-22 DIAGNOSIS — R1011 Right upper quadrant pain: Secondary | ICD-10-CM

## 2015-05-22 DIAGNOSIS — Z6832 Body mass index (BMI) 32.0-32.9, adult: Secondary | ICD-10-CM | POA: Insufficient documentation

## 2015-05-22 DIAGNOSIS — K8050 Calculus of bile duct without cholangitis or cholecystitis without obstruction: Secondary | ICD-10-CM

## 2015-05-22 DIAGNOSIS — D249 Benign neoplasm of unspecified breast: Secondary | ICD-10-CM | POA: Insufficient documentation

## 2015-05-22 DIAGNOSIS — R109 Unspecified abdominal pain: Secondary | ICD-10-CM | POA: Diagnosis present

## 2015-05-22 LAB — CBC WITH DIFFERENTIAL/PLATELET
BASOS ABS: 0 10*3/uL (ref 0.0–0.1)
Basophils Relative: 0 % (ref 0–1)
Eosinophils Absolute: 0.1 10*3/uL (ref 0.0–0.7)
Eosinophils Relative: 1 % (ref 0–5)
HEMATOCRIT: 39.2 % (ref 36.0–46.0)
Hemoglobin: 12.4 g/dL (ref 12.0–15.0)
LYMPHS ABS: 1.5 10*3/uL (ref 0.7–4.0)
LYMPHS PCT: 14 % (ref 12–46)
MCH: 26.6 pg (ref 26.0–34.0)
MCHC: 31.6 g/dL (ref 30.0–36.0)
MCV: 84.1 fL (ref 78.0–100.0)
MONO ABS: 0.5 10*3/uL (ref 0.1–1.0)
Monocytes Relative: 5 % (ref 3–12)
Neutro Abs: 8.3 10*3/uL — ABNORMAL HIGH (ref 1.7–7.7)
Neutrophils Relative %: 80 % — ABNORMAL HIGH (ref 43–77)
PLATELETS: 254 10*3/uL (ref 150–400)
RBC: 4.66 MIL/uL (ref 3.87–5.11)
RDW: 19.4 % — ABNORMAL HIGH (ref 11.5–15.5)
WBC: 10.4 10*3/uL (ref 4.0–10.5)

## 2015-05-22 LAB — COMPREHENSIVE METABOLIC PANEL
ALT: 45 U/L (ref 14–54)
ANION GAP: 10 (ref 5–15)
AST: 105 U/L — ABNORMAL HIGH (ref 15–41)
Albumin: 4 g/dL (ref 3.5–5.0)
Alkaline Phosphatase: 104 U/L (ref 38–126)
BILIRUBIN TOTAL: 0.8 mg/dL (ref 0.3–1.2)
BUN: 14 mg/dL (ref 6–20)
CO2: 26 mmol/L (ref 22–32)
Calcium: 8.8 mg/dL — ABNORMAL LOW (ref 8.9–10.3)
Chloride: 104 mmol/L (ref 101–111)
Creatinine, Ser: 0.82 mg/dL (ref 0.44–1.00)
GFR calc Af Amer: >60 mL/min (ref >60)
GLUCOSE: 104 mg/dL — AB (ref 65–99)
Potassium: 4.2 mmol/L (ref 3.5–5.1)
SODIUM: 140 mmol/L (ref 135–145)
Total Protein: 8 g/dL (ref 6.5–8.1)

## 2015-05-22 LAB — LIPASE, BLOOD: Lipase: 17 U/L — ABNORMAL LOW (ref 22–51)

## 2015-05-22 LAB — POC URINE PREG, ED: PREG TEST UR: NEGATIVE

## 2015-05-22 MED ORDER — SODIUM CHLORIDE 0.9 % IV BOLUS (SEPSIS)
1000.0000 mL | INTRAVENOUS | Status: AC
  Administered 2015-05-22: 1000 mL via INTRAVENOUS

## 2015-05-22 MED ORDER — MORPHINE SULFATE 2 MG/ML IJ SOLN
2.0000 mg | INTRAMUSCULAR | Status: DC | PRN
  Administered 2015-05-22 – 2015-05-23 (×2): 4 mg via INTRAVENOUS
  Filled 2015-05-22 (×2): qty 2

## 2015-05-22 MED ORDER — PROMETHAZINE HCL 25 MG/ML IJ SOLN
12.5000 mg | Freq: Once | INTRAMUSCULAR | Status: AC
  Administered 2015-05-22: 12.5 mg via INTRAVENOUS
  Filled 2015-05-22: qty 1

## 2015-05-22 MED ORDER — ONDANSETRON HCL 4 MG/2ML IJ SOLN
4.0000 mg | Freq: Once | INTRAMUSCULAR | Status: AC
  Administered 2015-05-22: 4 mg via INTRAVENOUS
  Filled 2015-05-22: qty 2

## 2015-05-22 MED ORDER — HYDROMORPHONE HCL 1 MG/ML IJ SOLN
1.0000 mg | Freq: Once | INTRAMUSCULAR | Status: AC
  Administered 2015-05-22: 1 mg via INTRAVENOUS
  Filled 2015-05-22: qty 1

## 2015-05-22 NOTE — ED Notes (Signed)
MD surgeon at bedside.

## 2015-05-22 NOTE — H&P (Signed)
Catherine Mckenzie is an 34 y.o. female.    Chief Complaint: Abdominal pain  HPI:  Patient is a pleasant 34 year old female who presents with worsening and more frequent right flank and right upper quadrant abdominal pain. She began having episodes of pain after the birth of her child in February of this year. She describes sharp stabbing and aching pain which initially was mostly in her right flank and would occur at night. At first it was about once a month and not very severe. However in recent weeks and particularly over the last week she has had increasingly severe episodes of pain. Sometimes related to eating but not always. She had a particularly severe episode 3 days ago and was seen in the emergency department. CT scan showed some calcification in the wall of the gallbladder (porcelain gallbladder). She was treated and improved and arrangement was made for outpatient follow-up in our office. However she's continued to have some episodic pain and this evening had the most severe episode she has yet had with very pronounced pain in her right upper quadrant radiating through to her back. Nausea and belching but no vomiting. She presented to the emergency department. She continues to have pain about 5 hours after presentation. No fever or chills or jaundice. No change in bowel movements. No urinary symptoms. She has a strong family history of gallstones in her mother and her sister  Past Medical History  Diagnosis Date  . Fibroadenoma     left breast  . Thyroid disease   . Allergy   . Migraine headache without aura   . FIBROADENOMA, BREAST 04/27/2007    Qualifier: Diagnosis of  By: Fuller Plan CMA (AAMA), Terri Skains      Past Surgical History  Procedure Laterality Date  . Cesarean section    . Wisdom tooth extraction      x4    Family History  Problem Relation Age of Onset  . Diabetes Mother   . Hypertension Mother   . Hyperlipidemia Mother   . Stroke Mother     TIA  . Asthma Sister   .  Depression Sister   . Cancer Maternal Grandmother 50    PANCREATIC  . Heart disease Maternal Grandfather   . Hypertension Maternal Grandfather   . Hypertension Paternal Grandmother   . Cancer Paternal Grandmother     BREAST  . Stroke Paternal Grandmother   . Cancer Paternal Grandfather     PROSTATE  . Seizures Paternal Grandfather   . Hypertension Paternal Grandfather   . Stroke Paternal Grandfather    Social History:  reports that she has never smoked. She has never used smokeless tobacco. She reports that she drinks alcohol. She reports that she does not use illicit drugs.  Allergies:  Allergies  Allergen Reactions  . Levaquin [Levofloxacin] Other (See Comments)    panic attacks  . Moxifloxacin Other (See Comments)    panic attacks  . Other     Benzo-, benzyl-, "anything with benz-"prefix.    . Soy Allergy Swelling  . Sulfur Nausea And Vomiting  . Wheat Bran Swelling  . Benzocaine Rash  . Benzoyl Peroxide-Erythromycin Rash    No current facility-administered medications for this encounter.   Current Outpatient Prescriptions  Medication Sig Dispense Refill  . acetaminophen (TYLENOL) 500 MG tablet Take 1,000 mg by mouth every 6 (six) hours as needed. For migraine    . ARMOUR THYROID 60 MG tablet TAKE 2 TABLETS (120 MG TOTAL) BY MOUTH DAILY BEFORE BREAKFAST. (Patient  taking differently: 60 mg daily by mouth before breakfast.) 60 tablet 3  . cetirizine (ZYRTEC) 10 MG tablet Take 10 mg by mouth daily.    Marland Kitchen EPIPEN 2-PAK 0.3 MG/0.3ML SOAJ Inject 0.03 mg into the muscle as needed (allergies.).     Marland Kitchen ergocalciferol (DRISDOL) 8000 UNIT/ML drops Take 3,000 Units by mouth daily.     . norethindrone (MICRONOR,CAMILA,ERRIN) 0.35 MG tablet Take 1 tablet (0.35 mg total) by mouth daily. 1 Package 11  . oxyCODONE-acetaminophen (PERCOCET) 10-325 MG per tablet Take 1 tablet by mouth once.    . Prenatal Vit-Fe Fumarate-FA (PRENATAL MULTIVITAMIN) TABS tablet Take 1 tablet by mouth daily at 12  noon.    . terbinafine (LAMISIL) 1 % cream Apply 1 application topically daily. Apply to infected toenail.    . traMADol (ULTRAM) 50 MG tablet Take 1 tablet (50 mg total) by mouth every 6 (six) hours as needed. (Patient taking differently: Take 50 mg by mouth every 6 (six) hours as needed for moderate pain. ) 9 tablet 0  . Tuberculin-Allergy Syringes (ALLERGY SYRINGE 1CC/27GX1/2") 27G X 1/2" 1 ML MISC 1 each by Does not apply route once a week. Pt stated that she gets allergy injections weekly    . ARMOUR THYROID 60 MG tablet TAKE 2 TABLETS (120 MG TOTAL) BY MOUTH DAILY BEFORE BREAKFAST. (Patient not taking: Reported on 05/22/2015) 60 tablet 3  . docusate sodium (COLACE) 100 MG capsule Take 1 capsule (100 mg total) by mouth 2 (two) times daily as needed. (Patient not taking: Reported on 05/22/2015) 30 capsule 2  . ibuprofen (ADVIL,MOTRIN) 600 MG tablet Take 1 tablet (600 mg total) by mouth every 6 (six) hours. (Patient not taking: Reported on 05/22/2015) 30 tablet 0     Results for orders placed or performed during the hospital encounter of 05/22/15 (from the past 48 hour(s))  POC urine preg, ED (not at [facility])     Status: None   Collection Time: 05/22/15  8:01 PM  Result Value Ref Range   Preg Test, Ur NEGATIVE NEGATIVE    Comment:        THE SENSITIVITY OF THIS METHODOLOGY IS >24 mIU/mL   CBC with Differential/Platelet     Status: Abnormal   Collection Time: 05/22/15  8:12 PM  Result Value Ref Range   WBC 10.4 4.0 - 10.5 K/uL   RBC 4.66 3.87 - 5.11 MIL/uL   Hemoglobin 12.4 12.0 - 15.0 g/dL   HCT 39.2 36.0 - 46.0 %   MCV 84.1 78.0 - 100.0 fL   MCH 26.6 26.0 - 34.0 pg   MCHC 31.6 30.0 - 36.0 g/dL   RDW 19.4 (H) 11.5 - 15.5 %   Platelets 254 150 - 400 K/uL   Neutrophils Relative % 80 (H) 43 - 77 %   Neutro Abs 8.3 (H) 1.7 - 7.7 K/uL   Lymphocytes Relative 14 12 - 46 %   Lymphs Abs 1.5 0.7 - 4.0 K/uL   Monocytes Relative 5 3 - 12 %   Monocytes Absolute 0.5 0.1 - 1.0 K/uL   Eosinophils  Relative 1 0 - 5 %   Eosinophils Absolute 0.1 0.0 - 0.7 K/uL   Basophils Relative 0 0 - 1 %   Basophils Absolute 0.0 0.0 - 0.1 K/uL  Comprehensive metabolic panel     Status: Abnormal   Collection Time: 05/22/15  8:12 PM  Result Value Ref Range   Sodium 140 135 - 145 mmol/L   Potassium 4.2 3.5 - 5.1  mmol/L   Chloride 104 101 - 111 mmol/L   CO2 26 22 - 32 mmol/L   Glucose, Bld 104 (H) 65 - 99 mg/dL   BUN 14 6 - 20 mg/dL   Creatinine, Ser 0.82 0.44 - 1.00 mg/dL   Calcium 8.8 (L) 8.9 - 10.3 mg/dL   Total Protein 8.0 6.5 - 8.1 g/dL   Albumin 4.0 3.5 - 5.0 g/dL   AST 105 (H) 15 - 41 U/L   ALT 45 14 - 54 U/L   Alkaline Phosphatase 104 38 - 126 U/L   Total Bilirubin 0.8 0.3 - 1.2 mg/dL   GFR calc non Af Amer >60 >60 mL/min   GFR calc Af Amer >60 >60 mL/min    Comment: (NOTE) The eGFR has been calculated using the CKD EPI equation. This calculation has not been validated in all clinical situations. eGFR's persistently <60 mL/min signify possible Chronic Kidney Disease.    Anion gap 10 5 - 15  Lipase, blood     Status: Abnormal   Collection Time: 05/22/15  8:12 PM  Result Value Ref Range   Lipase 17 (L) 22 - 51 U/L   US Abdomen Limited Ruq  05/22/2015   CLINICAL DATA:  Right upper quadrant pain.  EXAM: US ABDOMEN LIMITED - RIGHT UPPER QUADRANT  COMPARISON:  Abdominal CT from 3 days ago  FINDINGS: Gallbladder:  Cholelithiasis (stones are small) and sludge. Reported porcelain gallbladder on the previous study is not seen sonographically. There is no wall thickening or focal tenderness.  Common bile duct:  Diameter: 4 mm  Liver:  No focal lesion identified. Within normal limits in parenchymal echogenicity. Antegrade flow in the imaged portal venous system.  IMPRESSION: Cholelithiasis without acute cholecystitis.   Electronically Signed   By: Monte Fantasia M.D.   On: 05/22/2015 20:54    Review of Systems  Constitutional: Negative for fever, chills, weight loss and malaise/fatigue.   Respiratory: Negative.   Cardiovascular: Negative.   Gastrointestinal: Positive for nausea and abdominal pain. Negative for vomiting, diarrhea, constipation and blood in stool.  Genitourinary: Negative.     Blood pressure 126/62, pulse 68, temperature 97.6 F (36.4 C), temperature source Oral, resp. rate 16, SpO2 98 %, currently breastfeeding. Physical Exam  General: Alert, well-developed Caucasian female, in no distress Skin: Warm and dry without rash or infection. HEENT: No palpable masses or thyromegaly. Sclera nonicteric. Pupils equal round and reactive. Oropharynx clear. Lungs: Breath sounds clear and equal without increased work of breathing Cardiovascular: Regular rate and rhythm without murmur. No JVD or edema. Peripheral pulses intact. Abdomen: Nondistended. Mild epigastric and right upper quadrant tenderness without guarding. No masses palpable. No organomegaly. No palpable hernias. Extremities: No edema or joint swelling or deformity. No chronic venous stasis changes. Neurologic: Alert and fully oriented. Affect normal   Assessment/Plan Repeated and worsening episodes of pain entirely consistent with biliary colic with ultrasound showing small gallstones and a CT scan showing partially calcified gallbladder wall. She does not have evidence of acute cholecystitis but is having almost daily frequent severe attacks of pain and therefore I believe would be best served with urgent cholecystectomy for relief of symptoms and prevent complications. I discussed the procedure and its nature and risks with the patient and she strongly desires to proceed with surgery. She will be admitted tonight and pain and nausea control and planned laparoscopic cholecystectomy in the a.m.  Dniya Neuhaus T 05/22/2015, 10:35 PM

## 2015-05-22 NOTE — ED Provider Notes (Signed)
CSN: [id number]     Arrival date & time 05/22/15  1844 History   First MD Initiated Contact with Patient 05/22/15 1854     Chief Complaint  Patient presents with  . Abdominal Pain  . Nausea     (Consider location/radiation/quality/duration/timing/severity/associated sxs/prior Treatment) Patient is a 34 y.o. female presenting with abdominal pain. The history is provided by the patient.  Abdominal Pain Pain location:  Epigastric and RUQ Pain quality: aching   Pain radiates to:  Back Pain severity:  Moderate Onset quality:  Sudden Duration:  3 hours Timing:  Constant Progression:  Unchanged Chronicity:  Recurrent Context comment:  Several hours after eating Relieved by: fentanyl en route. Worsened by:  Nothing tried Ineffective treatments:  None tried Associated symptoms: nausea   Associated symptoms: no chest pain, no cough, no diarrhea, no dysuria, no fatigue, no fever, no hematuria, no shortness of breath and no vomiting     Past Medical History  Diagnosis Date  . Fibroadenoma     left breast  . Thyroid disease   . Allergy   . Migraine headache without aura   . FIBROADENOMA, BREAST 04/27/2007    Qualifier: Diagnosis of  By: Fuller Plan CMA (AAMA), Terri Skains     Past Surgical History  Procedure Laterality Date  . Cesarean section    . Wisdom tooth extraction      x4   Family History  Problem Relation Age of Onset  . Diabetes Mother   . Hypertension Mother   . Hyperlipidemia Mother   . Stroke Mother     TIA  . Asthma Sister   . Depression Sister   . Cancer Maternal Grandmother 50    PANCREATIC  . Heart disease Maternal Grandfather   . Hypertension Maternal Grandfather   . Hypertension Paternal Grandmother   . Cancer Paternal Grandmother     BREAST  . Stroke Paternal Grandmother   . Cancer Paternal Grandfather     PROSTATE  . Seizures Paternal Grandfather   . Hypertension Paternal Grandfather   . Stroke Paternal Grandfather    History  Substance Use Topics  .  Smoking status: Never Smoker   . Smokeless tobacco: Never Used  . Alcohol Use: Yes     Comment: rarely   OB History    Gravida Para Term Preterm AB TAB SAB Ectopic Multiple Living   3 2 2  1  1   0 1     Review of Systems  Constitutional: Negative for fever and fatigue.  HENT: Negative for congestion and drooling.   Eyes: Negative for pain.  Respiratory: Negative for cough and shortness of breath.   Cardiovascular: Negative for chest pain.  Gastrointestinal: Positive for nausea and abdominal pain. Negative for vomiting and diarrhea.  Genitourinary: Negative for dysuria and hematuria.  Musculoskeletal: Negative for back pain, gait problem and neck pain.  Skin: Negative for color change.  Neurological: Negative for dizziness and headaches.  Hematological: Negative for adenopathy.  Psychiatric/Behavioral: Negative for behavioral problems.  All other systems reviewed and are negative.     Allergies  Levaquin; Moxifloxacin; Soy allergy; Wheat bran; Benzocaine; and Benzoyl peroxide-erythromycin  Home Medications   Prior to Admission medications   Medication Sig Start Date End Date Taking? Authorizing Provider  acetaminophen (TYLENOL) 500 MG tablet Take 1,000 mg by mouth every 6 (six) hours as needed. For migraine    Historical Provider, MD  ARMOUR THYROID 60 MG tablet TAKE 2 TABLETS (120 MG TOTAL) BY MOUTH DAILY BEFORE BREAKFAST. 02/01/15  Donnamae Jude, MD  ARMOUR THYROID 60 MG tablet TAKE 2 TABLETS (120 MG TOTAL) BY MOUTH DAILY BEFORE BREAKFAST. 04/04/15   Donnamae Jude, MD  cetirizine (ZYRTEC) 10 MG tablet Take 10 mg by mouth daily.    Historical Provider, MD  docusate sodium (COLACE) 100 MG capsule Take 1 capsule (100 mg total) by mouth 2 (two) times daily as needed. 01/06/15   Lisa A Leftwich-Kirby, CNM  EPIPEN 2-PAK 0.3 MG/0.3ML SOAJ Inject 0.03 mg into the muscle as needed. 03/01/13   Historical Provider, MD  ergocalciferol (DRISDOL) 8000 UNIT/ML drops Take 4,000 Units by mouth  daily.     Historical Provider, MD  ibuprofen (ADVIL,MOTRIN) 600 MG tablet Take 1 tablet (600 mg total) by mouth every 6 (six) hours. 01/06/15   Kathie Dike Leftwich-Kirby, CNM  NONFORMULARY OR COMPOUNDED ITEM Take 1 capsule by mouth daily. MMB 5mg /2.5mg /50 mg    Historical Provider, MD  norethindrone (MICRONOR,CAMILA,ERRIN) 0.35 MG tablet Take 1 tablet (0.35 mg total) by mouth daily. 01/06/15   Elvera Maria, CNM  Prenatal Vit-Fe Fumarate-FA (PRENATAL MULTIVITAMIN) TABS tablet Take 1 tablet by mouth daily at 12 noon.    Historical Provider, MD  terbinafine (LAMISIL) 1 % cream Apply 1 application topically daily.    Historical Provider, MD  traMADol (ULTRAM) 50 MG tablet Take 1 tablet (50 mg total) by mouth every 6 (six) hours as needed. 05/19/15   April Palumbo, MD  Tuberculin-Allergy Syringes (ALLERGY SYRINGE 1CC/27GX1/2") 27G X 1/2" 1 ML MISC 1 each by Does not apply route once a week. Pt stated that she gets allergy injections weekly    Historical Provider, MD   BP 126/62 mmHg  Pulse 68  Temp(Src) 97.6 F (36.4 C) (Oral)  Resp 16  SpO2 98% Physical Exam  Constitutional: She is oriented to person, place, and time. She appears well-developed and well-nourished.  HENT:  Head: Normocephalic.  Mouth/Throat: Oropharynx is clear and moist. No oropharyngeal exudate.  Eyes: Conjunctivae and EOM are normal. Pupils are equal, round, and reactive to light.  Neck: Normal range of motion. Neck supple.  Cardiovascular: Normal rate, regular rhythm, normal heart sounds and intact distal pulses.  Exam reveals no gallop and no friction rub.   No murmur heard. Pulmonary/Chest: Effort normal and breath sounds normal. No respiratory distress. She has no wheezes.  Abdominal: Soft. Bowel sounds are normal. There is tenderness (mild ttp of RUQ/epig area). There is no rebound and no guarding.  Musculoskeletal: Normal range of motion. She exhibits no edema or tenderness.  Neurological: She is alert and oriented  to person, place, and time.  Skin: Skin is warm and dry.  Psychiatric: She has a normal mood and affect. Her behavior is normal.  Nursing note and vitals reviewed.   ED Course  Procedures (including critical care time) Labs Review Labs Reviewed  CBC WITH DIFFERENTIAL/PLATELET - Abnormal; Notable for the following:    RDW 19.4 (*)    Neutrophils Relative % 80 (*)    Neutro Abs 8.3 (*)    All other components within normal limits  COMPREHENSIVE METABOLIC PANEL - Abnormal; Notable for the following:    Glucose, Bld 104 (*)    Calcium 8.8 (*)    AST 105 (*)    All other components within normal limits  LIPASE, BLOOD - Abnormal; Notable for the following:    Lipase 17 (*)    All other components within normal limits  POC URINE PREG, ED    Imaging Review US  Abdomen Limited Ruq  05/22/2015   CLINICAL DATA:  Right upper quadrant pain.  EXAM: US ABDOMEN LIMITED - RIGHT UPPER QUADRANT  COMPARISON:  Abdominal CT from 3 days ago  FINDINGS: Gallbladder:  Cholelithiasis (stones are small) and sludge. Reported porcelain gallbladder on the previous study is not seen sonographically. There is no wall thickening or focal tenderness.  Common bile duct:  Diameter: 4 mm  Liver:  No focal lesion identified. Within normal limits in parenchymal echogenicity. Antegrade flow in the imaged portal venous system.  IMPRESSION: Cholelithiasis without acute cholecystitis.   Electronically Signed   By: Monte Fantasia M.D.   On: 05/22/2015 20:54     EKG Interpretation None      MDM   Final diagnoses:  RUQ pain  Biliary colic    5:28 PM 34 y.o. female who presents with right upper quadrant and epigastric pain which began around 4:30 this evening. The patient has been having intermittent pain in this area radiating to her back over the last few months. She was seen at [facility] recently and had a CT scan without contrast of her abdomen and pelvis showing a porcelain gallbladder. She notes acute  worsening of her pains evening. She has had nausea but no vomiting. She notes some mild intermittent diarrhea. Denies fevers. Vital signs unremarkable here. Will get ultrasound of right upper quadrant. Patient is postpartum about 10 months and is currently breast-feeding. She did get fentanyl in route and was instructed to pump and dump.  Discussed case w/ Dr. Excell Seltzer ([facility]) who will admit for cholecystectomy.   Pamella Pert, MD 05/22/15 [phone]

## 2015-05-22 NOTE — ED Notes (Signed)
Per [facility]- Seen at [facility] Saturday for abdominal pain RUQ and nausea. DX with gallbladder issues and need for removal. Here for same complaint. Referred for GI. Appt next month. Pain 10/10. IVP Fentanyl 21mcg given and Zofran 4mg  given in route.

## 2015-05-23 ENCOUNTER — Encounter (HOSPITAL_COMMUNITY)
Admission: EM | Disposition: A | Discharge status: Home / Self Care | Payer: Self-pay | Source: Home / Self Care | Attending: Emergency Medicine

## 2015-05-23 ENCOUNTER — Observation Stay (HOSPITAL_COMMUNITY): Payer: BLUE CROSS/BLUE SHIELD | Admitting: Anesthesiology

## 2015-05-23 ENCOUNTER — Observation Stay (HOSPITAL_COMMUNITY): Payer: BLUE CROSS/BLUE SHIELD

## 2015-05-23 ENCOUNTER — Encounter (HOSPITAL_COMMUNITY): Payer: Self-pay | Admitting: Certified Registered"

## 2015-05-23 HISTORY — PX: CHOLECYSTECTOMY: SHX55

## 2015-05-23 LAB — SURGICAL PCR SCREEN
MRSA, PCR: NEGATIVE
STAPHYLOCOCCUS AUREUS: POSITIVE — AB

## 2015-05-23 SURGERY — LAPAROSCOPIC CHOLECYSTECTOMY WITH INTRAOPERATIVE CHOLANGIOGRAM
Anesthesia: General

## 2015-05-23 MED ORDER — CEFAZOLIN SODIUM-DEXTROSE 2-3 GM-% IV SOLR: 2.0000 g | INTRAVENOUS | Status: DC

## 2015-05-23 MED ORDER — ONDANSETRON HCL 4 MG/2ML IJ SOLN
INTRAMUSCULAR | Status: DC | PRN
  Administered 2015-05-23: 4 mg via INTRAVENOUS

## 2015-05-23 MED ORDER — FENTANYL CITRATE (PF) 250 MCG/5ML IJ SOLN
INTRAMUSCULAR | Status: AC
  Filled 2015-05-23: qty 5

## 2015-05-23 MED ORDER — ONDANSETRON HCL 4 MG/2ML IJ SOLN
INTRAMUSCULAR | Status: AC
  Filled 2015-05-23: qty 2

## 2015-05-23 MED ORDER — BUPIVACAINE-EPINEPHRINE 0.5% -1:200000 IJ SOLN
INTRAMUSCULAR | Status: DC | PRN
  Administered 2015-05-23: 20 mL

## 2015-05-23 MED ORDER — LACTATED RINGERS IV SOLN
INTRAVENOUS | Status: DC | PRN
  Administered 2015-05-23 (×2): via INTRAVENOUS

## 2015-05-23 MED ORDER — CEFAZOLIN SODIUM-DEXTROSE 2-3 GM-% IV SOLR
2.0000 g | INTRAVENOUS | Status: AC
  Administered 2015-05-23: 2 g via INTRAVENOUS

## 2015-05-23 MED ORDER — ENOXAPARIN SODIUM 40 MG/0.4ML ~~LOC~~ SOLN
40.0000 mg | SUBCUTANEOUS | Status: DC
Start: 2015-05-24 — End: 2015-05-25
  Administered 2015-05-24 – 2015-05-25 (×2): 40 mg via SUBCUTANEOUS
  Filled 2015-05-23 (×3): qty 0.4

## 2015-05-23 MED ORDER — MIDAZOLAM HCL 2 MG/2ML IJ SOLN: 0.5000 mg | Freq: Once | INTRAMUSCULAR | Status: AC | PRN

## 2015-05-23 MED ORDER — ONDANSETRON HCL 4 MG PO TABS: 4.0000 mg | ORAL_TABLET | Freq: Four times a day (QID) | ORAL | Status: DC | PRN

## 2015-05-23 MED ORDER — FENTANYL CITRATE (PF) 100 MCG/2ML IJ SOLN
INTRAMUSCULAR | Status: AC
  Filled 2015-05-23: qty 2

## 2015-05-23 MED ORDER — ROCURONIUM BROMIDE 100 MG/10ML IV SOLN
INTRAVENOUS | Status: AC
  Filled 2015-05-23: qty 1

## 2015-05-23 MED ORDER — DEXAMETHASONE SODIUM PHOSPHATE 10 MG/ML IJ SOLN
INTRAMUSCULAR | Status: DC | PRN
  Administered 2015-05-23: 10 mg via INTRAVENOUS

## 2015-05-23 MED ORDER — 0.9 % SODIUM CHLORIDE (POUR BTL) OPTIME
TOPICAL | Status: DC | PRN
Start: 2015-05-23 — End: 2015-05-23
  Administered 2015-05-23: 500 mL

## 2015-05-23 MED ORDER — CHLORHEXIDINE GLUCONATE 0.12 % MT SOLN
15.0000 mL | Freq: Two times a day (BID) | OROMUCOSAL | Status: DC
  Administered 2015-05-23: 15 mL via OROMUCOSAL
  Filled 2015-05-23 (×2): qty 15

## 2015-05-23 MED ORDER — LACTATED RINGERS IV SOLN
INTRAVENOUS | Status: DC
  Administered 2015-05-23: 11:00:00 via INTRAVENOUS

## 2015-05-23 MED ORDER — FENTANYL CITRATE (PF) 100 MCG/2ML IJ SOLN
INTRAMUSCULAR | Status: DC | PRN
  Administered 2015-05-23: 100 ug via INTRAVENOUS
  Administered 2015-05-23 (×5): 50 ug via INTRAVENOUS

## 2015-05-23 MED ORDER — KCL IN DEXTROSE-NACL 20-5-0.9 MEQ/L-%-% IV SOLN
INTRAVENOUS | Status: DC
  Administered 2015-05-23: 01:00:00 via INTRAVENOUS
  Filled 2015-05-23 (×2): qty 1000

## 2015-05-23 MED ORDER — NEOSTIGMINE METHYLSULFATE 10 MG/10ML IV SOLN
INTRAVENOUS | Status: DC | PRN
  Administered 2015-05-23: 4 mg via INTRAVENOUS

## 2015-05-23 MED ORDER — ROCURONIUM BROMIDE 100 MG/10ML IV SOLN
INTRAVENOUS | Status: DC | PRN
  Administered 2015-05-23: 35 mg via INTRAVENOUS
  Administered 2015-05-23: 5 mg via INTRAVENOUS
  Administered 2015-05-23: 10 mg via INTRAVENOUS

## 2015-05-23 MED ORDER — ONDANSETRON HCL 4 MG/2ML IJ SOLN: 4.0000 mg | Freq: Four times a day (QID) | INTRAMUSCULAR | Status: DC | PRN

## 2015-05-23 MED ORDER — CEFAZOLIN SODIUM-DEXTROSE 2-3 GM-% IV SOLR
INTRAVENOUS | Status: AC
Start: 2015-05-23 — End: 2015-05-23
  Filled 2015-05-23: qty 50

## 2015-05-23 MED ORDER — LACTATED RINGERS IR SOLN
Status: DC | PRN
  Administered 2015-05-23: 1

## 2015-05-23 MED ORDER — PROPOFOL 10 MG/ML IV BOLUS
INTRAVENOUS | Status: AC
  Filled 2015-05-23: qty 20

## 2015-05-23 MED ORDER — LIDOCAINE HCL (CARDIAC) 20 MG/ML IV SOLN
INTRAVENOUS | Status: AC
  Filled 2015-05-23: qty 5

## 2015-05-23 MED ORDER — LIDOCAINE HCL (CARDIAC) 20 MG/ML IV SOLN
INTRAVENOUS | Status: DC | PRN
  Administered 2015-05-23: 50 mg via INTRAVENOUS

## 2015-05-23 MED ORDER — HYDROMORPHONE HCL 1 MG/ML IJ SOLN: 0.5000 mg | INTRAMUSCULAR | Status: DC | PRN

## 2015-05-23 MED ORDER — HYDROMORPHONE HCL 1 MG/ML IJ SOLN: 1.0000 mg | INTRAMUSCULAR | Status: DC | PRN

## 2015-05-23 MED ORDER — PROMETHAZINE HCL 25 MG/ML IJ SOLN
6.2500 mg | INTRAMUSCULAR | Status: DC | PRN
  Administered 2015-05-23: 6.25 mg via INTRAVENOUS

## 2015-05-23 MED ORDER — MIDAZOLAM HCL 2 MG/2ML IJ SOLN
INTRAMUSCULAR | Status: AC
  Filled 2015-05-23: qty 2

## 2015-05-23 MED ORDER — HEPARIN SODIUM (PORCINE) 5000 UNIT/ML IJ SOLN
5000.0000 [IU] | Freq: Three times a day (TID) | INTRAMUSCULAR | Status: AC
  Administered 2015-05-23: 5000 [IU] via SUBCUTANEOUS
  Filled 2015-05-23: qty 1

## 2015-05-23 MED ORDER — ONDANSETRON HCL 4 MG/2ML IJ SOLN
4.0000 mg | Freq: Four times a day (QID) | INTRAMUSCULAR | Status: DC | PRN
  Administered 2015-05-23 – 2015-05-24 (×2): 4 mg via INTRAVENOUS
  Filled 2015-05-23: qty 2

## 2015-05-23 MED ORDER — CHLORHEXIDINE GLUCONATE 4 % EX LIQD
1.0000 "application " | Freq: Once | CUTANEOUS | Status: DC
  Filled 2015-05-23: qty 15

## 2015-05-23 MED ORDER — OXYCODONE-ACETAMINOPHEN 5-325 MG PO TABS
1.0000 | ORAL_TABLET | ORAL | Status: DC | PRN
  Administered 2015-05-23 (×2): 1 via ORAL
  Administered 2015-05-24 (×2): 2 via ORAL
  Administered 2015-05-25 (×2): 1 via ORAL
  Filled 2015-05-23: qty 1
  Filled 2015-05-23: qty 2
  Filled 2015-05-23: qty 1
  Filled 2015-05-23: qty 2
  Filled 2015-05-23 (×2): qty 1

## 2015-05-23 MED ORDER — MEPERIDINE HCL 50 MG/ML IJ SOLN
6.2500 mg | INTRAMUSCULAR | Status: DC | PRN
  Administered 2015-05-23 (×2): 6.25 mg via INTRAVENOUS

## 2015-05-23 MED ORDER — MEPERIDINE HCL 50 MG/ML IJ SOLN
INTRAMUSCULAR | Status: AC
  Filled 2015-05-23: qty 1

## 2015-05-23 MED ORDER — MIDAZOLAM HCL 5 MG/5ML IJ SOLN
INTRAMUSCULAR | Status: DC | PRN
  Administered 2015-05-23: 2 mg via INTRAVENOUS

## 2015-05-23 MED ORDER — SUCCINYLCHOLINE CHLORIDE 20 MG/ML IJ SOLN
INTRAMUSCULAR | Status: DC | PRN
  Administered 2015-05-23: 100 mg via INTRAVENOUS

## 2015-05-23 MED ORDER — GLYCOPYRROLATE 0.2 MG/ML IJ SOLN
INTRAMUSCULAR | Status: DC | PRN
  Administered 2015-05-23: 0.6 mg via INTRAVENOUS

## 2015-05-23 MED ORDER — PROPOFOL 10 MG/ML IV BOLUS
INTRAVENOUS | Status: DC | PRN
  Administered 2015-05-23: 180 mg via INTRAVENOUS

## 2015-05-23 MED ORDER — BUPIVACAINE-EPINEPHRINE (PF) 0.5% -1:200000 IJ SOLN
INTRAMUSCULAR | Status: AC
  Filled 2015-05-23: qty 30

## 2015-05-23 MED ORDER — MORPHINE SULFATE 2 MG/ML IJ SOLN
2.0000 mg | INTRAMUSCULAR | Status: DC | PRN
  Administered 2015-05-23 (×2): 4 mg via INTRAVENOUS
  Filled 2015-05-23 (×2): qty 2

## 2015-05-23 MED ORDER — DEXAMETHASONE SODIUM PHOSPHATE 10 MG/ML IJ SOLN
INTRAMUSCULAR | Status: AC
  Filled 2015-05-23: qty 1

## 2015-05-23 MED ORDER — POTASSIUM CHLORIDE IN NACL 20-0.9 MEQ/L-% IV SOLN
INTRAVENOUS | Status: DC
  Administered 2015-05-23 – 2015-05-25 (×4): via INTRAVENOUS
  Filled 2015-05-23 (×7): qty 1000

## 2015-05-23 MED ORDER — PROMETHAZINE HCL 25 MG/ML IJ SOLN
INTRAMUSCULAR | Status: AC
  Filled 2015-05-23: qty 1

## 2015-05-23 SURGICAL SUPPLY — 34 items
ADH SKN CLS APL DERMABOND .7 (GAUZE/BANDAGES/DRESSINGS) ×1
APL SKNCLS STERI-STRIP NONHPOA (GAUZE/BANDAGES/DRESSINGS)
APPLIER CLIP ROT 10 11.4 M/L (STAPLE) ×2
APR CLP MED LRG 11.4X10 (STAPLE) ×1
BAG SPEC RTRVL LRG 6X4 10 (ENDOMECHANICALS) ×1
BENZOIN TINCTURE PRP APPL 2/3 (GAUZE/BANDAGES/DRESSINGS) IMPLANT
CLIP APPLIE ROT 10 11.4 M/L (STAPLE) ×1 IMPLANT
COVER MAYO STAND STRL (DRAPES) ×2 IMPLANT
DECANTER SPIKE VIAL GLASS SM (MISCELLANEOUS) ×1 IMPLANT
DERMABOND ADVANCED (GAUZE/BANDAGES/DRESSINGS) ×1
DERMABOND ADVANCED .7 DNX12 (GAUZE/BANDAGES/DRESSINGS) IMPLANT
DRAPE C-ARM 42X120 X-RAY (DRAPES) ×2 IMPLANT
DRAPE LAPAROSCOPIC ABDOMINAL (DRAPES) ×2 IMPLANT
ELECT REM PT RETURN 9FT ADLT (ELECTROSURGICAL) ×2
ELECTRODE REM PT RTRN 9FT ADLT (ELECTROSURGICAL) ×1 IMPLANT
GLOVE EUDERMIC 7 POWDERFREE (GLOVE) ×2 IMPLANT
GOWN STRL REUS W/TWL XL LVL3 (GOWN DISPOSABLE) ×8 IMPLANT
HEMOSTAT SNOW SURGICEL 2X4 (HEMOSTASIS) IMPLANT
KIT BASIN OR (CUSTOM PROCEDURE TRAY) ×2 IMPLANT
LIQUID BAND (GAUZE/BANDAGES/DRESSINGS) ×2 IMPLANT
NS IRRIG 1000ML POUR BTL (IV SOLUTION) ×1 IMPLANT
POUCH SPECIMEN RETRIEVAL 10MM (ENDOMECHANICALS) ×2 IMPLANT
SCISSORS LAP 5X35 DISP (ENDOMECHANICALS) ×2 IMPLANT
SET CHOLANGIOGRAPH MIX (MISCELLANEOUS) ×2 IMPLANT
SET IRRIG TUBING LAPAROSCOPIC (IRRIGATION / IRRIGATOR) ×2 IMPLANT
SLEEVE XCEL OPT CAN 5 100 (ENDOMECHANICALS) ×2 IMPLANT
STRIP CLOSURE SKIN 1/2X4 (GAUZE/BANDAGES/DRESSINGS) IMPLANT
SUT MNCRL AB 4-0 PS2 18 (SUTURE) ×3 IMPLANT
TOWEL OR 17X26 10 PK STRL BLUE (TOWEL DISPOSABLE) ×2 IMPLANT
TOWEL OR NON WOVEN STRL DISP B (DISPOSABLE) ×2 IMPLANT
TRAY LAPAROSCOPIC (CUSTOM PROCEDURE TRAY) ×2 IMPLANT
TROCAR BLADELESS OPT 5 100 (ENDOMECHANICALS) ×2 IMPLANT
TROCAR XCEL BLUNT TIP 100MML (ENDOMECHANICALS) ×2 IMPLANT
TROCAR XCEL NON-BLD 11X100MML (ENDOMECHANICALS) ×2 IMPLANT

## 2015-05-23 NOTE — Transfer of Care (Signed)
Immediate Anesthesia Transfer of Care Note  Patient: Catherine Mckenzie  Procedure(s) Performed: Procedure(s): LAPAROSCOPIC CHOLECYSTECTOMY (N/A)  Patient Location: PACU  Anesthesia Type:General  Level of Consciousness: awake, alert  and oriented  Airway & Oxygen Therapy: Patient Spontanous Breathing and Patient connected to face mask oxygen  Post-op Assessment: Report given to RN and Post -op Vital signs reviewed and stable  Post vital signs: Reviewed and stable  Last Vitals:  Filed Vitals:   05/23/15 0804  BP: 130/78  Pulse: 67  Temp: 36.6 C  Resp: 16    Complications: No apparent anesthesia complications

## 2015-05-23 NOTE — Op Note (Signed)
Patient Name:           Catherine Mckenzie   Date of Surgery:        05/23/2015  Pre op Diagnosis:      Subacute and chronic cholecystitis with cholelithiasis  Post op Diagnosis:    Same  Procedure:                 Laparoscopic cholecystectomy  Surgeon:                     Edsel Petrin. Dalbert Batman, M.D., FACS  Assistant:                      Kalman Shan, [place]  Operative Indications:   This is a healthy 34 year old white female.  5 months postpartum.  She has had a four-month history of intermittent episodes of right upper quadrant pain and nausea consistent with biliary colic.  The skin and ultrasound show a little bit of calcification in the gallbladder wall and gallstones.  Liver function tests are normal except for AST is 105.  She was admitted last night because of recurrent attacks of pain.  She feels much better this morning and is brought to the operating for cholecystectomy.  Operative Findings:       The gallbladder was edematous.  It contains gallstones.  The cystic duct was long and narrow.  I made several attempts to perform a cholangiogram, the contrast would not flow through the cystic duct.  I could not milk any stones out of the cystic duct.  I eventually abandoned attempts at the cholangiogram.  The liver, stomach, duodenum, small intestine, large intestine were otherwise normal to inspection.  Procedure in Detail:          Following the induction of general endotracheal anesthesia the patient's abdomen was prepped and draped in a sterile fashion.  Intravenous antibiotic's were given.  Surgical timeout was performed.  0.5% Marcaine with epinephrine was used as a local infiltration anesthesia.    A vertical incision was made in the lower rim of the umbilicus.  The fascia was incised in the midline and the abdominal cavity entered under direct vision.  An 11 mm Hassan trocar was inserted and secured with the Purstring suture of 0 Vicryl.  Pneumoperitoneum was created.  Video camera was  inserted with visualization of findings as described above.  A trocar was placed in subxiphoid region and two 5 mm trochars placed in the right upper quadrant.  We lifted up the gallbladder.  It was very tense and so we used a suction trocar to evacuate the bile.  We dissected some adhesions down off of the lower body and infundibulum.  We spent a long time dissecting out the cystic duct and cystic artery.  We created a large window behind both structures and felt that we had a good critical view.  The cystic artery was secured with multiple medical clips and divided.  The cholangiogram catheter was inserted into the cystic duct we could not get contrast to flow through the cystic duct.  Tried several maneuvers with milking the duct, dissecting it out further, and varying positions of the tip of the cholangiocatheter but could not get the contrast to flow.  We felt the cystic duct was probably obstructed due to chronic inflammation.     The cholangiogram catheter was removed and the cystic duct secured with multiple medical clips and divided.  I found a small posterior branch of  the cystic artery going up onto the wall of the gallbladder, isolated it, secured it with metal clips and divided.  Gallbladder was then dissected from its bed uneventfully, placed in a specimen bag and removed.  The operative field was copiously irrigated with saline.  Hemostasis was excellent and had been achieved electrocautery.  We evacuated all the irrigation fluid which became clear.  Trochars were removed.  There was no bleeding.  Pneumoperitoneum was released.  The fascia at the umbilicus was closed with 0 vicryl sutures and the skin incisions closed with subcuticular sutures of 4-0 Monocryl and Dermabond.  The patient tolerated the procedure well was taken to PACU in stable condition.  EBL 15-20 mL.  Counts correct.  Complications none.     Edsel Petrin. Dalbert Batman, M.D., FACS General and Minimally Invasive Surgery Breast and  Colorectal Surgery  05/23/2015 10:11 AM

## 2015-05-23 NOTE — Anesthesia Procedure Notes (Signed)
Procedure Name: Intubation Date/Time: 05/23/2015 9:06 AM Performed by: Noralyn Pick D Pre-anesthesia Checklist: Patient identified, Emergency Drugs available, Suction available and Patient being monitored Patient Re-evaluated:Patient Re-evaluated prior to inductionOxygen Delivery Method: Circle System Utilized Preoxygenation: Pre-oxygenation with 100% oxygen Intubation Type: IV induction, Rapid sequence and Cricoid Pressure applied Laryngoscope Size: Mac and 3 Grade View: Grade I Tube type: Oral Tube size: 7.5 mm Number of attempts: 1 Airway Equipment and Method: Stylet and Oral airway Placement Confirmation: ETT inserted through vocal cords under direct vision,  positive ETCO2 and breath sounds checked- equal and bilateral Secured at: 21 cm Tube secured with: Tape Dental Injury: Teeth and Oropharynx as per pre-operative assessment

## 2015-05-23 NOTE — Progress Notes (Signed)
  Subjective: History reviewed.  Patient examined.  Husband present throughout encounter. She is feeling somewhat better this morning. History consistent with accelerating recurrent biliary colic as birth of her child in February of this year.  Is nauseated but does not vomit. CT scan shows partial calcification in the wall the gallbladder and ultrasound shows gallstones but no wall thickening or signs of acute cholecystitis. Lab work shows elevated AST at 1 and 5.  The CBC 10,400 but with left shift.  Pregnancy test negative.  Objective: Vital signs in last 24 hours: Temp:  [97.6 F (36.4 C)-98.9 F (37.2 C)] 98.9 F (37.2 C) (07/07 0210) Pulse Rate:  [68-94] 83 (07/07 0210) Resp:  [16] 16 (07/07 0210) BP: (114-146)/(56-77) 114/56 mmHg (07/07 0210) SpO2:  [98 %-100 %] 100 % (07/07 0210) Weight:  [95.2 kg (209 lb 14.1 oz)] 95.2 kg (209 lb 14.1 oz) (07/07 0051) Last BM Date: 05/22/15  Intake/Output from previous day: 07/06 0701 - 07/07 0700 In: 1145 [I.V.:1145] Out: 400 [Urine:400] Intake/Output this shift: Total I/O In: 1145 [I.V.:1145] Out: 400 [Urine:400]  General appearance: Pleasant.  Cooperative.  No distress. GI: Abdomen is soft and nontender this morning.  Not distended.  No mass.  Well-healed Pfannenstiel incision.  Lab Results:   Recent Labs  05/22/15 2012  WBC 10.4  HGB 12.4  HCT 39.2  PLT 254   BMET  Recent Labs  05/22/15 2012  NA 140  K 4.2  CL 104  CO2 26  GLUCOSE 104*  BUN 14  CREATININE 0.82  CALCIUM 8.8*   PT/INR No results for input(s): LABPROT, INR in the last 72 hours. ABG No results for input(s): PHART, HCO3 in the last 72 hours.  Invalid input(s): PCO2, PO2  Studies/Results: US Abdomen Limited Ruq  05/22/2015   CLINICAL DATA:  Right upper quadrant pain.  EXAM: US ABDOMEN LIMITED - RIGHT UPPER QUADRANT  COMPARISON:  Abdominal CT from 3 days ago  FINDINGS: Gallbladder:  Cholelithiasis (stones are small) and sludge. Reported  porcelain gallbladder on the previous study is not seen sonographically. There is no wall thickening or focal tenderness.  Common bile duct:  Diameter: 4 mm  Liver:  No focal lesion identified. Within normal limits in parenchymal echogenicity. Antegrade flow in the imaged portal venous system.  IMPRESSION: Cholelithiasis without acute cholecystitis.   Electronically Signed   By: Monte Fantasia M.D.   On: 05/22/2015 20:54    Anti-infectives: Anti-infectives    Start     Dose/Rate Route Frequency Ordered Stop   05/23/15 0900  ceFAZolin (ANCEF) IVPB 2 g/50 mL premix     2 g 100 mL/hr over 30 Minutes Intravenous On call 05/23/15 0009 05/24/15 0900      Assessment/Plan: s/p Procedure(s): LAPAROSCOPIC CHOLECYSTECTOMY WITH INTRAOPERATIVE CHOLANGIOGRAM  Chronic and possible subacute cholecystitis with cholelithiasis.  Accelerating biliary colic Proceed  with laparoscopic cholecystectomy with cholangiogram possible open cholecystectomy today. I've explained the indications, details, techniques, and numerous risk of the surgery with the patient and her husband.  She's aware of the risk of bleeding, infection, conversion to open laparotomy, bile leak, injury to adjacent organs with major reconstructive surgery, wound healing problems, hernia, and other unforeseen problems.  She understands these issues well.  At this time all of her questions are answered.  She agrees with this plan.  History cesarean section 2012 5 months postpartum.      Adin Hector 05/23/2015

## 2015-05-23 NOTE — Anesthesia Postprocedure Evaluation (Signed)
  Anesthesia Post-op Note  Patient: Catherine Mckenzie  Procedure(s) Performed: Procedure(s): LAPAROSCOPIC CHOLECYSTECTOMY (N/A)  Patient Location: PACU  Anesthesia Type:General  Level of Consciousness: awake, alert , oriented and patient cooperative  Airway and Oxygen Therapy: Patient Spontanous Breathing  Post-op Pain: mild  Post-op Assessment: Post-op Vital signs reviewed, Patient's Cardiovascular Status Stable, Respiratory Function Stable, Patent Airway, No signs of Nausea or vomiting and Pain level controlled              Post-op Vital Signs: Reviewed and stable  Last Vitals:  Filed Vitals:   05/23/15 1237  BP: 129/67  Pulse: 68  Temp: 36.3 C  Resp: 14    Complications: No apparent anesthesia complications, patient verbalizes she will pump and dispose of breast milk while she is taking narcotics post operatively

## 2015-05-23 NOTE — Anesthesia Preprocedure Evaluation (Addendum)
Anesthesia Evaluation  Patient identified by MRN, date of birth, ID band Patient awake    Reviewed: Allergy & Precautions, NPO status , Patient's Chart, lab work & pertinent test results  History of Anesthesia Complications Negative for: history of anesthetic complications  Airway Mallampati: I  TM Distance: >3 FB Neck ROM: Full    Dental  (+) Dental Advisory Given   Pulmonary neg pulmonary ROS,  breath sounds clear to auscultation        Cardiovascular negative cardio ROS  Rhythm:Regular Rate:Normal     Neuro/Psych  Headaches,    GI/Hepatic Neg liver ROS, Nausea with acute chole   Endo/Other  Hypothyroidism Morbid obesity  Renal/GU negative Renal ROS     Musculoskeletal   Abdominal (+) + obese,   Peds  Hematology negative hematology ROS (+)   Anesthesia Other Findings   Reproductive/Obstetrics                            Anesthesia Physical Anesthesia Plan  ASA: II  Anesthesia Plan: General   Post-op Pain Management:    Induction: Intravenous and Rapid sequence  Airway Management Planned: Oral ETT  Additional Equipment:   Intra-op Plan:   Post-operative Plan: Extubation in OR  Informed Consent: I have reviewed the patients History and Physical, chart, labs and discussed the procedure including the risks, benefits and alternatives for the proposed anesthesia with the patient or authorized representative who has indicated his/her understanding and acceptance.   Dental advisory given  Plan Discussed with: CRNA and Surgeon  Anesthesia Plan Comments: (Plan routine monitors, GETA)        Anesthesia Quick Evaluation

## 2015-05-24 ENCOUNTER — Encounter (HOSPITAL_COMMUNITY)
Admission: EM | Disposition: A | Discharge status: Home / Self Care | Payer: Self-pay | Source: Home / Self Care | Attending: Emergency Medicine

## 2015-05-24 ENCOUNTER — Observation Stay (HOSPITAL_COMMUNITY): Payer: BLUE CROSS/BLUE SHIELD

## 2015-05-24 ENCOUNTER — Observation Stay (HOSPITAL_COMMUNITY): Payer: BLUE CROSS/BLUE SHIELD | Admitting: Registered Nurse

## 2015-05-24 ENCOUNTER — Encounter (HOSPITAL_COMMUNITY): Payer: Self-pay | Admitting: Anesthesiology

## 2015-05-24 HISTORY — PX: ERCP: SHX5425

## 2015-05-24 LAB — COMPREHENSIVE METABOLIC PANEL
ALBUMIN: 3.2 g/dL — AB (ref 3.5–5.0)
ALT: 521 U/L — ABNORMAL HIGH (ref 14–54)
AST: 596 U/L — ABNORMAL HIGH (ref 15–41)
Alkaline Phosphatase: 128 U/L — ABNORMAL HIGH (ref 38–126)
Anion gap: 7 (ref 5–15)
BUN: 5 mg/dL — ABNORMAL LOW (ref 6–20)
CO2: 25 mmol/L (ref 22–32)
CREATININE: 0.52 mg/dL (ref 0.44–1.00)
Calcium: 8.6 mg/dL — ABNORMAL LOW (ref 8.9–10.3)
Chloride: 108 mmol/L (ref 101–111)
Glucose, Bld: 106 mg/dL — ABNORMAL HIGH (ref 65–99)
Potassium: 4.3 mmol/L (ref 3.5–5.1)
SODIUM: 140 mmol/L (ref 135–145)
TOTAL PROTEIN: 6.7 g/dL (ref 6.5–8.1)
Total Bilirubin: 3.7 mg/dL — ABNORMAL HIGH (ref 0.3–1.2)

## 2015-05-24 LAB — CBC
HEMATOCRIT: 35 % — AB (ref 36.0–46.0)
Hemoglobin: 11.4 g/dL — ABNORMAL LOW (ref 12.0–15.0)
MCH: 27.5 pg (ref 26.0–34.0)
MCHC: 32.6 g/dL (ref 30.0–36.0)
MCV: 84.3 fL (ref 78.0–100.0)
Platelets: 218 10*3/uL (ref 150–400)
RBC: 4.15 MIL/uL (ref 3.87–5.11)
RDW: 19.6 % — AB (ref 11.5–15.5)
WBC: 5.4 10*3/uL (ref 4.0–10.5)

## 2015-05-24 LAB — GLUCOSE, CAPILLARY: GLUCOSE-CAPILLARY: 228 mg/dL — AB (ref 65–99)

## 2015-05-24 LAB — LIPASE, BLOOD: LIPASE: 14 U/L — AB (ref 22–51)

## 2015-05-24 SURGERY — ERCP, WITH INTERVENTION IF INDICATED
Anesthesia: General

## 2015-05-24 SURGERY — ERCP, WITH INTERVENTION IF INDICATED
Anesthesia: Monitor Anesthesia Care

## 2015-05-24 MED ORDER — PROPOFOL 10 MG/ML IV BOLUS
INTRAVENOUS | Status: AC
  Filled 2015-05-24: qty 20

## 2015-05-24 MED ORDER — LIDOCAINE HCL (CARDIAC) 20 MG/ML IV SOLN
INTRAVENOUS | Status: DC | PRN
  Administered 2015-05-24: 100 mg via INTRAVENOUS

## 2015-05-24 MED ORDER — DEXAMETHASONE SODIUM PHOSPHATE 10 MG/ML IJ SOLN
INTRAMUSCULAR | Status: AC
Start: 2015-05-24 — End: 2015-05-24
  Filled 2015-05-24: qty 1

## 2015-05-24 MED ORDER — PROPOFOL 10 MG/ML IV BOLUS
INTRAVENOUS | Status: DC | PRN
  Administered 2015-05-24: 150 mg via INTRAVENOUS

## 2015-05-24 MED ORDER — SODIUM CHLORIDE 0.9 % IV SOLN: INTRAVENOUS | Status: DC

## 2015-05-24 MED ORDER — ONDANSETRON HCL 4 MG/2ML IJ SOLN
INTRAMUSCULAR | Status: AC
  Filled 2015-05-24: qty 2

## 2015-05-24 MED ORDER — SODIUM CHLORIDE 0.9 % IV SOLN
INTRAVENOUS | Status: DC | PRN
  Administered 2015-05-24: 20 mL

## 2015-05-24 MED ORDER — DEXAMETHASONE SODIUM PHOSPHATE 10 MG/ML IJ SOLN
INTRAMUSCULAR | Status: DC | PRN
  Administered 2015-05-24: 10 mg via INTRAVENOUS

## 2015-05-24 MED ORDER — EPHEDRINE SULFATE 50 MG/ML IJ SOLN
INTRAMUSCULAR | Status: AC
  Filled 2015-05-24: qty 1

## 2015-05-24 MED ORDER — GLYCOPYRROLATE 0.2 MG/ML IJ SOLN
INTRAMUSCULAR | Status: AC
  Filled 2015-05-24: qty 4

## 2015-05-24 MED ORDER — SUCCINYLCHOLINE CHLORIDE 20 MG/ML IJ SOLN
INTRAMUSCULAR | Status: DC | PRN
  Administered 2015-05-24: 100 mg via INTRAVENOUS

## 2015-05-24 MED ORDER — SODIUM CHLORIDE 0.9 % IJ SOLN
INTRAMUSCULAR | Status: AC
  Filled 2015-05-24: qty 10

## 2015-05-24 MED ORDER — MIDAZOLAM HCL 5 MG/5ML IJ SOLN
INTRAMUSCULAR | Status: DC | PRN
  Administered 2015-05-24: 2 mg via INTRAVENOUS

## 2015-05-24 MED ORDER — ROCURONIUM BROMIDE 100 MG/10ML IV SOLN
INTRAVENOUS | Status: DC | PRN
  Administered 2015-05-24: 20 mg via INTRAVENOUS

## 2015-05-24 MED ORDER — NEOSTIGMINE METHYLSULFATE 10 MG/10ML IV SOLN
INTRAVENOUS | Status: DC | PRN
  Administered 2015-05-24: 5 mg via INTRAVENOUS

## 2015-05-24 MED ORDER — INDOMETHACIN 50 MG RE SUPP
100.0000 mg | Freq: Once | RECTAL | Status: DC
Start: 2015-05-24 — End: 2015-05-24
  Filled 2015-05-24: qty 2

## 2015-05-24 MED ORDER — INDOMETHACIN 50 MG RE SUPP
RECTAL | Status: AC
  Filled 2015-05-24: qty 2

## 2015-05-24 MED ORDER — GLYCOPYRROLATE 0.2 MG/ML IJ SOLN
INTRAMUSCULAR | Status: DC | PRN
  Administered 2015-05-24: .8 mg via INTRAVENOUS

## 2015-05-24 MED ORDER — LIDOCAINE HCL (CARDIAC) 20 MG/ML IV SOLN
INTRAVENOUS | Status: AC
  Filled 2015-05-24: qty 5

## 2015-05-24 MED ORDER — MIDAZOLAM HCL 2 MG/2ML IJ SOLN
INTRAMUSCULAR | Status: AC
  Filled 2015-05-24: qty 2

## 2015-05-24 MED ORDER — NEOSTIGMINE METHYLSULFATE 10 MG/10ML IV SOLN
INTRAVENOUS | Status: AC
Start: 2015-05-24 — End: 2015-05-24
  Filled 2015-05-24: qty 1

## 2015-05-24 MED ORDER — FENTANYL CITRATE (PF) 100 MCG/2ML IJ SOLN
INTRAMUSCULAR | Status: DC | PRN
  Administered 2015-05-24 (×2): 50 ug via INTRAVENOUS
  Administered 2015-05-24: 25 ug via INTRAVENOUS

## 2015-05-24 MED ORDER — CEFAZOLIN SODIUM-DEXTROSE 2-3 GM-% IV SOLR
2.0000 g | Freq: Once | INTRAVENOUS | Status: AC
  Administered 2015-05-24: 2 g via INTRAVENOUS
  Filled 2015-05-24: qty 50

## 2015-05-24 MED ORDER — FENTANYL CITRATE (PF) 250 MCG/5ML IJ SOLN
INTRAMUSCULAR | Status: AC
  Filled 2015-05-24: qty 5

## 2015-05-24 NOTE — Transfer of Care (Signed)
Immediate Anesthesia Transfer of Care Note  Patient: Catherine Mckenzie  Procedure(s) Performed: Procedure(s): ENDOSCOPIC RETROGRADE CHOLANGIOPANCREATOGRAPHY (ERCP) (N/A)  Patient Location: PACU  Anesthesia Type:General  Level of Consciousness: awake, alert , oriented and patient cooperative  Airway & Oxygen Therapy: Patient Spontanous Breathing and Patient connected to face mask oxygen  Post-op Assessment: Report given to RN, Post -op Vital signs reviewed and stable and Patient moving all extremities  Post vital signs: Reviewed and stable  Last Vitals:  Filed Vitals:   05/24/15 1214  BP: 141/61  Pulse: 60  Temp: 36.7 C  Resp: 15    Complications: No apparent anesthesia complications

## 2015-05-24 NOTE — Progress Notes (Signed)
1 Day Post-Op  Subjective: Patient states she is feeling pretty well.  Tolerating liquids without nausea.  Minimal pain.  Ambulating to bathroom.. Afebrile.  Heart rate 60.  Respirations unlabored.  Liver function tests have gone up, raising concern for retained CBD stone. Total bilirubin 3.7.  Transaminases in the 500 range.  Lipase pending.  Hemoglobin 11.4.  WBC 5400.  I explained operative events with her and her husband.  She is aware that the surgery went well technically but we were unable to obtain a cholangiogram due to obstruction of the cystic duct despite multiple attempts. I explained to her that the most likely cause of this was retained CBD stone.  I told her there was a smaller but possible chance of CBD injury.  I told her this need to be sorted out today. She saw Dr. Collene Mares recently.  We will contact her to see if ERCP can be arranged today.  Objective: Vital signs in last 24 hours: Temp:  [97.4 F (36.3 C)-98.8 F (37.1 C)] 98.6 F (37 C) (07/08 0200) Pulse Rate:  [51-83] 60 (07/08 0200) Resp:  [14-16] 16 (07/08 0200) BP: (102-142)/(48-98) 117/53 mmHg (07/08 0200) SpO2:  [95 %-100 %] 99 % (07/08 0200) Last BM Date: 05/22/15  Intake/Output from previous day: 07/07 0701 - 07/08 0700 In: 3426 [P.O.:600; I.V.:2826] Out: 4550 [Urine:4550] Intake/Output this shift: Total I/O In: 1400 [P.O.:600; I.V.:800] Out: 2550 [Urine:2550]  General appearance: Alert.  Cooperative.  No distress.  Vital status normal. GI: Abdomen soft.  Nontender.  Nondistended.  Wounds look good.  Benign postop exam.  Lab Results:  Results for orders placed or performed during the hospital encounter of 05/22/15 (from the past 24 hour(s))  Comprehensive metabolic panel     Status: Abnormal   Collection Time: 05/24/15  4:26 AM  Result Value Ref Range   Sodium 140 135 - 145 mmol/L   Potassium 4.3 3.5 - 5.1 mmol/L   Chloride 108 101 - 111 mmol/L   CO2 25 22 - 32 mmol/L   Glucose, Bld 106 (H) 65  - 99 mg/dL   BUN 5 (L) 6 - 20 mg/dL   Creatinine, Ser 0.52 0.44 - 1.00 mg/dL   Calcium 8.6 (L) 8.9 - 10.3 mg/dL   Total Protein 6.7 6.5 - 8.1 g/dL   Albumin 3.2 (L) 3.5 - 5.0 g/dL   AST 596 (H) 15 - 41 U/L   ALT 521 (H) 14 - 54 U/L   Alkaline Phosphatase 128 (H) 38 - 126 U/L   Total Bilirubin 3.7 (H) 0.3 - 1.2 mg/dL   GFR calc non Af Amer >60 >60 mL/min   GFR calc Af Amer >60 >60 mL/min   Anion gap 7 5 - 15  CBC     Status: Abnormal   Collection Time: 05/24/15  4:26 AM  Result Value Ref Range   WBC 5.4 4.0 - 10.5 K/uL   RBC 4.15 3.87 - 5.11 MIL/uL   Hemoglobin 11.4 (L) 12.0 - 15.0 g/dL   HCT 35.0 (L) 36.0 - 46.0 %   MCV 84.3 78.0 - 100.0 fL   MCH 27.5 26.0 - 34.0 pg   MCHC 32.6 30.0 - 36.0 g/dL   RDW 19.6 (H) 11.5 - 15.5 %   Platelets 218 150 - 400 K/uL     Studies/Results: No results found.  Marland Kitchen  ceFAZolin (ANCEF) IV  2 g Intravenous On Call to OR  . chlorhexidine  1 application Topical Once  . enoxaparin (LOVENOX) injection  40 mg Subcutaneous Q24H     Assessment/Plan: s/p Procedure(s): LAPAROSCOPIC CHOLECYSTECTOMY   POD #1.  Laparoscopic cholecystectomy with attempted but unsuccessful cholangiogram No evidence of bleeding or bile leak. Elevated LFTs suggest retained CBD stone, less likely CBD injury. I discussed this with the patient and explained to her the differential diagnosis of retained CBD stones on, CBD injury.  Doubt bile leak since clinically she she is asymptomatic. I discussed this case with Dr. Collene Mares at this hour.  She will have Dr. Benson Norway see the patient today and plan ERCP today. NPO.  4 months post partum   @PROBHOSP @     Gaelle Adriance M 05/24/2015  . .prob

## 2015-05-24 NOTE — Anesthesia Postprocedure Evaluation (Signed)
  Anesthesia Post-op Note  Patient: Catherine Mckenzie  Procedure(s) Performed: Procedure(s) (LRB): ENDOSCOPIC RETROGRADE CHOLANGIOPANCREATOGRAPHY (ERCP) (N/A)  Patient Location: PACU  Anesthesia Type: General  Level of Consciousness: awake and alert   Airway and Oxygen Therapy: Patient Spontanous Breathing  Post-op Pain: mild  Post-op Assessment: Post-op Vital signs reviewed, Patient's Cardiovascular Status Stable, Respiratory Function Stable, Patent Airway and No signs of Nausea or vomiting  Last Vitals:  Filed Vitals:   05/24/15 1701  BP: 132/74  Pulse: 45  Temp: 36.6 C  Resp: 16    Post-op Vital Signs: stable   Complications: No apparent anesthesia complications

## 2015-05-24 NOTE — Anesthesia Preprocedure Evaluation (Addendum)
Anesthesia Evaluation  Patient identified by MRN, date of birth, ID band Patient awake    Reviewed: Allergy & Precautions, NPO status , Patient's Chart, lab work & pertinent test results  Airway Mallampati: II  TM Distance: >3 FB Neck ROM: Full    Dental no notable dental hx.    Pulmonary neg pulmonary ROS,  breath sounds clear to auscultation  Pulmonary exam normal       Cardiovascular Exercise Tolerance: Good negative cardio ROS Normal cardiovascular examRhythm:Regular Rate:Normal     Neuro/Psych  Headaches, negative psych ROS   GI/Hepatic negative GI ROS, Neg liver ROS,   Endo/Other  Hypothyroidism   Renal/GU negative Renal ROS  negative genitourinary   Musculoskeletal negative musculoskeletal ROS (+)   Abdominal (+) + obese,   Peds negative pediatric ROS (+)  Hematology negative hematology ROS (+)   Anesthesia Other Findings   Reproductive/Obstetrics negative OB ROS                             Anesthesia Physical Anesthesia Plan  ASA: II  Anesthesia Plan: General   Post-op Pain Management:    Induction: Intravenous  Airway Management Planned: Oral ETT  Additional Equipment:   Intra-op Plan:   Post-operative Plan: Extubation in OR  Informed Consent: I have reviewed the patients History and Physical, chart, labs and discussed the procedure including the risks, benefits and alternatives for the proposed anesthesia with the patient or authorized representative who has indicated his/her understanding and acceptance.   Dental advisory given  Plan Discussed with: CRNA  Anesthesia Plan Comments:         Anesthesia Quick Evaluation

## 2015-05-24 NOTE — Op Note (Signed)
[facility] [place], [address]   ERCP PROCEDURE REPORT        EXAM DATE: 06-17-2015  PATIENT NAME:          Catherine Mckenzie, Catherine Mckenzie          MR #: [id number] BIRTHDATE:       [birth year]     VISIT #:     [phone] ATTENDING:     Carol Ada, MD     STATUS:     outpatient ASSISTANT:      Maudry Diego, and Hilma Favors   INDICATIONS:  The patient is a 34 yr old female here for an ERCP due to possible choledocholithiasis. PROCEDURE PERFORMED:     ERCP with removal of calculus/calculi  MEDICATIONS:     General Anesthesia  CONSENT: The patient understands the risks and benefits of the procedure and understands that these risks include, but are not limited to: sedation, allergic reaction, infection, perforation and/or bleeding. Alternative means of evaluation and treatment include, among others: physical exam, x-rays, and/or surgical intervention. The patient elects to proceed with this endoscopic procedure.  DESCRIPTION OF PROCEDURE: During intra-op preparation period all mechanical & medical equipment was checked for proper function. Hand hygiene and appropriate measures for infection prevention was taken. After the risks, benefits and alternatives of the procedure were thoroughly explained, Informed was verified, confirmed and timeout was successfully executed by the treatment team. With the patient in left semi-prone position, medications were administered intravenously.The    was passed from the mouth into the esophagus and further advanced from the esophagus into the stomach. From stomach scope was directed to the second portion of the duodenum. Major papilla was aligned with the duodenoscope. The scope position was confirmed fluoroscopically. Rest of the findings/therapeutics are given below. The scope was then completely withdrawn from the patient and the procedure completed. The pulse, BP, and O2 saturation were  monitored and documented by the physician and the nursing staff throughout the entire procedure. The patient was cared for as planned according to standard protocol. The patient was then discharged to recovery in stable condition and with appropriate post procedure care. Estimated blood loss is zero unless otherwise noted in this procedure report.  The ampulla was located in the second portion of the duodenum and it was normal in appearance.  Spontaneous bile drainage was noted. Cannulation of the CBD was difficult, but in a fully bowed sphincterotomy position cannulation of the CBD was achieved.  No cannulation of the PD occurred, but she did receive indomethacin 100 mg PR immediately post procedure.  The guidewire was secured in the right intrahepatic ducts.  Contrast injection revealed a minimally dilated CBD and no overt evidence of any stones.  There was no evidence of any cystic duct leaks or leaks for any accessory ducts.  A 1-1.2 cm sphincterotomy was created and then the duct was swept three times.  During the second sweep a small stone was extracted.  The final occlusion cholangiogram during the fourth pass was negative for any retained stones.  At this point the procedure was terminated and fluoroscopic images were saved.    ADVERSE EVENT:     No immediate. IMPRESSIONS:     1) Choledocholithiasis s/p successful stone extraction.  RECOMMENDATIONS:     1) Routine post operative care. REPEAT EXAM:   ___________________________________ Carol Ada, MD eSigned:  Carol Ada, MD 06/17/2015 2:04 PM   cc:  CPT CODES: ICD9 CODES:  The ICD  and CPT codes recommended by this software are interpretations from the data that the clinical staff has captured with the software.  The verification of the translation of this report to the ICD and CPT codes and modifiers is the sole responsibility of the health care institution and practicing physician where this report was  generated.  [employer] will not be held responsible for the validity of the ICD and CPT codes included on this report.  AMA assumes no liability for data contained or not contained herein. CPT is a Designer, television/film set of the [employer].   PATIENT NAME:  Catherine Mckenzie, Catherine Mckenzie MR#: [id number]

## 2015-05-24 NOTE — Progress Notes (Signed)
Pt returned to original unit with nurse from ENDO. Pt alert and oriented. Pt ambulated to bathroom prior to getting back into bed. Pt c/o no pain at this time. Pt upgraded to a clear liquid diet and tray has been ordered. Pt in NAD. Family at bedside.

## 2015-05-24 NOTE — Anesthesia Procedure Notes (Signed)
Procedure Name: Intubation Date/Time: 05/24/2015 12:54 PM Performed by: Carleene Cooper A Pre-anesthesia Checklist: Timeout performed, Patient identified, Emergency Drugs available, Suction available and Patient being monitored Patient Re-evaluated:Patient Re-evaluated prior to inductionOxygen Delivery Method: Circle system utilized Preoxygenation: Pre-oxygenation with 100% oxygen Intubation Type: IV induction Ventilation: Mask ventilation without difficulty Laryngoscope Size: Mac and 4 Grade View: Grade I Tube type: Oral Tube size: 7.5 mm Number of attempts: 1 Airway Equipment and Method: Stylet Placement Confirmation: breath sounds checked- equal and bilateral,  ETT inserted through vocal cords under direct vision and positive ETCO2 Secured at: 21 cm Tube secured with: Tape Dental Injury: Teeth and Oropharynx as per pre-operative assessment

## 2015-05-24 NOTE — Consult Note (Signed)
Reason for Consult: Probable choledocholithiasis Referring Physician: CCS  Carney Bern HPI: This is a 34 year old female s/p lap chole for symptomatic gallstones.  The surgery was performed without any complications, but the IOC was not able to be performed.  She was observed post surgery and her liver enzymes as well as her TB increased.  The admission ultrasound revealed a CBD of 4 mm  Past Medical History  Diagnosis Date  . Fibroadenoma     left breast  . Thyroid disease   . Allergy   . Migraine headache without aura   . FIBROADENOMA, BREAST 04/27/2007    Qualifier: Diagnosis of  By: Fuller Plan CMA (AAMA), Terri Skains      Past Surgical History  Procedure Laterality Date  . Cesarean section    . Wisdom tooth extraction      x4    Family History  Problem Relation Age of Onset  . Diabetes Mother   . Hypertension Mother   . Hyperlipidemia Mother   . Stroke Mother     TIA  . Asthma Sister   . Depression Sister   . Cancer Maternal Grandmother 50    PANCREATIC  . Heart disease Maternal Grandfather   . Hypertension Maternal Grandfather   . Hypertension Paternal Grandmother   . Cancer Paternal Grandmother     BREAST  . Stroke Paternal Grandmother   . Cancer Paternal Grandfather     PROSTATE  . Seizures Paternal Grandfather   . Hypertension Paternal Grandfather   . Stroke Paternal Grandfather     Social History:  reports that she has never smoked. She has never used smokeless tobacco. She reports that she drinks alcohol. She reports that she does not use illicit drugs.  Allergies:  Allergies  Allergen Reactions  . Levaquin [Levofloxacin] Other (See Comments)    panic attacks  . Moxifloxacin Other (See Comments)    panic attacks  . Other     Benzo-, benzyl-, "anything with benz-"prefix.    . Soy Allergy Swelling  . Sulfur Nausea And Vomiting  . Wheat Bran Swelling  . Benzocaine Rash  . Benzoyl Peroxide-Erythromycin Rash    Medications:  Scheduled: .  chlorhexidine  1 application Topical Once  . enoxaparin (LOVENOX) injection  40 mg Subcutaneous Q24H   Continuous: . 0.9 % NaCl with KCl 20 mEq / L 100 mL/hr at 05/24/15 0914  . lactated ringers 125 mL/hr at 05/23/15 1056    Results for orders placed or performed during the hospital encounter of 05/22/15 (from the past 24 hour(s))  Comprehensive metabolic panel     Status: Abnormal   Collection Time: 05/24/15  4:26 AM  Result Value Ref Range   Sodium 140 135 - 145 mmol/L   Potassium 4.3 3.5 - 5.1 mmol/L   Chloride 108 101 - 111 mmol/L   CO2 25 22 - 32 mmol/L   Glucose, Bld 106 (H) 65 - 99 mg/dL   BUN 5 (L) 6 - 20 mg/dL   Creatinine, Ser 0.52 0.44 - 1.00 mg/dL   Calcium 8.6 (L) 8.9 - 10.3 mg/dL   Total Protein 6.7 6.5 - 8.1 g/dL   Albumin 3.2 (L) 3.5 - 5.0 g/dL   AST 596 (H) 15 - 41 U/L   ALT 521 (H) 14 - 54 U/L   Alkaline Phosphatase 128 (H) 38 - 126 U/L   Total Bilirubin 3.7 (H) 0.3 - 1.2 mg/dL   GFR calc non Af Amer >60 >60 mL/min   GFR calc  Af Amer >60 >60 mL/min   Anion gap 7 5 - 15  CBC     Status: Abnormal   Collection Time: 05/24/15  4:26 AM  Result Value Ref Range   WBC 5.4 4.0 - 10.5 K/uL   RBC 4.15 3.87 - 5.11 MIL/uL   Hemoglobin 11.4 (L) 12.0 - 15.0 g/dL   HCT 35.0 (L) 36.0 - 46.0 %   MCV 84.3 78.0 - 100.0 fL   MCH 27.5 26.0 - 34.0 pg   MCHC 32.6 30.0 - 36.0 g/dL   RDW 19.6 (H) 11.5 - 15.5 %   Platelets 218 150 - 400 K/uL  Lipase, blood     Status: Abnormal   Collection Time: 05/24/15  4:26 AM  Result Value Ref Range   Lipase 14 (L) 22 - 51 U/L     US Abdomen Limited Ruq  05/22/2015   CLINICAL DATA:  Right upper quadrant pain.  EXAM: US ABDOMEN LIMITED - RIGHT UPPER QUADRANT  COMPARISON:  Abdominal CT from 3 days ago  FINDINGS: Gallbladder:  Cholelithiasis (stones are small) and sludge. Reported porcelain gallbladder on the previous study is not seen sonographically. There is no wall thickening or focal tenderness.  Common bile duct:  Diameter: 4 mm  Liver:   No focal lesion identified. Within normal limits in parenchymal echogenicity. Antegrade flow in the imaged portal venous system.  IMPRESSION: Cholelithiasis without acute cholecystitis.   Electronically Signed   By: Monte Fantasia M.D.   On: 05/22/2015 20:54    ROS:  As stated above in the HPI otherwise negative.  Blood pressure 141/61, pulse 60, temperature 98 F (36.7 C), temperature source Oral, resp. rate 15, height 5\' 7"  (1.702 m), weight 94.802 kg (209 lb), SpO2 100 %, currently breastfeeding.    PE: Gen: NAD, Alert and Oriented HEENT:  [place]/AT, EOMI Neck: Supple, no LAD Lungs: CTA Bilaterally CV: RRR without M/G/R ABM: Soft, NTND, +BS Ext: No C/C/E  Assessment/Plan: 1) Probable choledocholithiasis. 2) S/p lap chole.   I discussed the situation with the patient and her husband.  She acknowledges the risks of bleeding, infection, perforation, and pancreatitis and she wishes to proceed.  Plan: 1) ERCP with stone extraction.  Kristen Bushway D 05/24/2015, 12:39 PM

## 2015-05-25 LAB — CBC
HCT: 34.1 % — ABNORMAL LOW (ref 36.0–46.0)
Hemoglobin: 11 g/dL — ABNORMAL LOW (ref 12.0–15.0)
MCH: 27.3 pg (ref 26.0–34.0)
MCHC: 32.3 g/dL (ref 30.0–36.0)
MCV: 84.6 fL (ref 78.0–100.0)
PLATELETS: 220 10*3/uL (ref 150–400)
RBC: 4.03 MIL/uL (ref 3.87–5.11)
RDW: 19.7 % — ABNORMAL HIGH (ref 11.5–15.5)
WBC: 4.3 10*3/uL (ref 4.0–10.5)

## 2015-05-25 LAB — GLUCOSE, CAPILLARY: Glucose-Capillary: 93 mg/dL (ref 65–99)

## 2015-05-25 LAB — COMPREHENSIVE METABOLIC PANEL
ALBUMIN: 3 g/dL — AB (ref 3.5–5.0)
ALT: 590 U/L — ABNORMAL HIGH (ref 14–54)
ANION GAP: 7 (ref 5–15)
AST: 396 U/L — ABNORMAL HIGH (ref 15–41)
Alkaline Phosphatase: 134 U/L — ABNORMAL HIGH (ref 38–126)
BUN: 6 mg/dL (ref 6–20)
CALCIUM: 8.3 mg/dL — AB (ref 8.9–10.3)
CO2: 25 mmol/L (ref 22–32)
CREATININE: 0.68 mg/dL (ref 0.44–1.00)
Chloride: 107 mmol/L (ref 101–111)
GFR calc Af Amer: >60 mL/min (ref >60)
GFR calc non Af Amer: >60 mL/min (ref >60)
Glucose, Bld: 94 mg/dL (ref 65–99)
Potassium: 4.1 mmol/L (ref 3.5–5.1)
Sodium: 139 mmol/L (ref 135–145)
TOTAL PROTEIN: 6.3 g/dL — AB (ref 6.5–8.1)
Total Bilirubin: 1.3 mg/dL — ABNORMAL HIGH (ref 0.3–1.2)

## 2015-05-25 LAB — LIPASE, BLOOD: LIPASE: 14 U/L — AB (ref 22–51)

## 2015-05-25 MED ORDER — OXYCODONE-ACETAMINOPHEN 10-325 MG PO TABS: 1.0000 | ORAL_TABLET | ORAL | Status: DC | PRN

## 2015-05-25 MED ORDER — OXYCODONE-ACETAMINOPHEN 5-325 MG PO TABS: 1.0000 | ORAL_TABLET | ORAL | Status: DC | PRN

## 2015-05-25 NOTE — Discharge Summary (Signed)
Physician Discharge Summary  Patient ID: Catherine Mckenzie MRN: [id number] DOB/AGE: 1981-01-24 34 y.o.  Admit date: 05/22/2015 Discharge date: 05/25/2015  Admission Diagnoses:  Discharge Diagnoses:  Principal Problem:   Cholelithiasis   Discharged Condition: good  Hospital Course: This is a healthy 34 year old white female. 5 months postpartum. She has had a four-month history of intermittent episodes of right upper quadrant pain and nausea consistent with biliary colic. The ultrasound show a little bit of calcification in the gallbladder wall and gallstones.  She was taken to the OR the following morning by Dr Dalbert Batman.  OR Findings: The gallbladder was edematous. It contains gallstones. The cystic duct was long and narrow. I made several attempts to perform a cholangiogram, the contrast would not flow through the cystic duct. I could not milk any stones out of the cystic duct. I eventually abandoned attempts at the cholangiogram. The liver, stomach, duodenum, small intestine, large intestine were otherwise normal to inspection.  She underwent an ERCP by Dr Benson Norway due to elevated bilirubin after surgery.  This revealed a small stone, which was extracted.  The following morning she was doing well.  She was tolerating a diet and her pain was controlled.  Her bilirubin was trending down.    Consults: GI  Significant Diagnostic Studies: labs: cbc, chemistries  Treatments: procedures: ERCP and surgery: lap cholecystectomy   Discharge Exam: Blood pressure 137/62, pulse 50, temperature 98.5 F (36.9 C), temperature source Oral, resp. rate 18, height 5\' 7"  (1.702 m), weight 94.802 kg (209 lb), SpO2 100 %, currently breastfeeding. General appearance: alert and cooperative GI: normal findings: soft, non-tender Incision/Wound: clean, dry, intact   Disposition: 01-Home or Self Care     Medication List    STOP taking these medications        oxyCODONE-acetaminophen 10-325 MG per tablet   Commonly known as:  PERCOCET  Replaced by:  oxyCODONE-acetaminophen 5-325 MG per tablet      TAKE these medications        acetaminophen 500 MG tablet  Commonly known as:  TYLENOL  Take 1,000 mg by mouth every 6 (six) hours as needed. For migraine     ALLERGY SYRINGE 1CC/27GX1/2" 27G X 1/2" 1 ML Misc  1 each by Does not apply route once a week. Pt stated that she gets allergy injections weekly     ARMOUR THYROID 60 MG tablet  Generic drug:  thyroid  TAKE 2 TABLETS (120 MG TOTAL) BY MOUTH DAILY BEFORE BREAKFAST.     cetirizine 10 MG tablet  Commonly known as:  ZYRTEC  Take 10 mg by mouth daily.     docusate sodium 100 MG capsule  Commonly known as:  COLACE  Take 1 capsule (100 mg total) by mouth 2 (two) times daily as needed.     EPIPEN 2-PAK 0.3 mg/0.3 mL Soaj injection  Generic drug:  EPINEPHrine  Inject 0.03 mg into the muscle as needed (allergies.).     ergocalciferol 8000 UNIT/ML drops  Commonly known as:  DRISDOL  Take 3,000 Units by mouth daily.     ibuprofen 600 MG tablet  Commonly known as:  ADVIL,MOTRIN  Take 1 tablet (600 mg total) by mouth every 6 (six) hours.     norethindrone 0.35 MG tablet  Commonly known as:  MICRONOR,CAMILA,ERRIN  Take 1 tablet (0.35 mg total) by mouth daily.     oxyCODONE-acetaminophen 5-325 MG per tablet  Commonly known as:  PERCOCET/ROXICET  Take 1-2 tablets by mouth every 4 (four) hours  as needed for moderate pain.     prenatal multivitamin Tabs tablet  Take 1 tablet by mouth daily at 12 noon.     terbinafine 1 % cream  Commonly known as:  LAMISIL  Apply 1 application topically daily. Apply to infected toenail.     traMADol 50 MG tablet  Commonly known as:  ULTRAM  Take 1 tablet (50 mg total) by mouth every 6 (six) hours as needed.           Follow-up Information    Follow up with Adin Hector, MD. Schedule an appointment as soon as possible for a visit in 2 weeks.   Specialty:  General Surgery   Contact  information:   [address] [place] [address] [phone]       Signed: Rosario Adie 04/16/4430, 54:00 AM

## 2015-05-25 NOTE — Discharge Instructions (Signed)

## 2015-05-27 ENCOUNTER — Encounter (HOSPITAL_COMMUNITY): Payer: Self-pay | Admitting: General Surgery

## 2015-05-28 ENCOUNTER — Encounter (HOSPITAL_COMMUNITY): Payer: Self-pay | Admitting: Gastroenterology

## 2015-09-18 ENCOUNTER — Encounter: Payer: Self-pay | Admitting: *Deleted

## 2015-11-25 ENCOUNTER — Encounter: Payer: Self-pay | Admitting: *Deleted

## 2016-01-22 ENCOUNTER — Other Ambulatory Visit: Payer: Self-pay | Admitting: *Deleted

## 2016-01-22 DIAGNOSIS — Z3041 Encounter for surveillance of contraceptive pills: Secondary | ICD-10-CM

## 2016-01-22 MED ORDER — NORETHINDRONE 0.35 MG PO TABS: 1.0000 | ORAL_TABLET | Freq: Every day | ORAL | Status: DC

## 2016-01-22 NOTE — Telephone Encounter (Signed)
RF request for Catherine Mckenzie'd Pt will be due for annual in April

## 2016-03-13 ENCOUNTER — Encounter: Payer: Self-pay | Admitting: Family Medicine

## 2016-03-13 ENCOUNTER — Ambulatory Visit (INDEPENDENT_AMBULATORY_CARE_PROVIDER_SITE_OTHER): Payer: BLUE CROSS/BLUE SHIELD | Admitting: Family Medicine

## 2016-03-13 VITALS — BP 117/78 | HR 76 | Resp 18 | Ht 67.0 in | Wt 205.0 lb

## 2016-03-13 DIAGNOSIS — Z30011 Encounter for initial prescription of contraceptive pills: Secondary | ICD-10-CM | POA: Diagnosis not present

## 2016-03-13 DIAGNOSIS — Z01419 Encounter for gynecological examination (general) (routine) without abnormal findings: Secondary | ICD-10-CM

## 2016-03-13 MED ORDER — NORETHINDRONE-ETH ESTRADIOL 0.4-35 MG-MCG PO TABS: 1.0000 | ORAL_TABLET | Freq: Every day | ORAL | Status: DC

## 2016-03-13 NOTE — Progress Notes (Signed)
  Subjective:     Catherine Mckenzie is a 35 y.o. female and is here for a comprehensive physical exam. The patient reports problems - leaking urine and diarrhea since her cholecystectomy.  Social History   Social History  . Marital Status: Married    Spouse Name: N/A  . Number of Children: N/A  . Years of Education: N/A   Occupational History  . Not on file.   Social History Main Topics  . Smoking status: Never Smoker   . Smokeless tobacco: Never Used  . Alcohol Use: Yes     Comment: rarely  . Drug Use: No  . Sexual Activity:    Partners: Male    Birth Control/ Protection: Pill   Other Topics Concern  . Not on file   Social History Narrative   Health Maintenance  Topic Date Due  . HEMOGLOBIN A1C  1979/03/09  . PNEUMOCOCCAL POLYSACCHARIDE VACCINE (1) 08/14/1981  . FOOT EXAM  08/14/1989  . OPHTHALMOLOGY EXAM  08/14/1989  . URINE MICROALBUMIN  08/14/1989  . INFLUENZA VACCINE  06/16/2016  . PAP SMEAR  02/19/2018  . TETANUS/TDAP  10/15/2024  . HIV Screening  Completed    The following portions of the patient's history were reviewed and updated as appropriate: allergies, current medications, past family history, past medical history, past social history, past surgical history and problem list.  Review of Systems Pertinent items noted in HPI and remainder of comprehensive ROS otherwise negative.   Objective:    BP 117/78 mmHg  Pulse 76  Resp 18  Ht 5\' 7"  (1.702 m)  Wt 205 lb (92.987 kg)  BMI 32.10 kg/m2  Breastfeeding? No General appearance: alert, cooperative and appears stated age Head: Normocephalic, without obvious abnormality, atraumatic Neck: no adenopathy, supple, symmetrical, trachea midline and thyroid not enlarged, symmetric, no tenderness/mass/nodules Lungs: clear to auscultation bilaterally Breasts: normal appearance, no masses or tenderness Heart: regular rate and rhythm, S1, S2 normal, no murmur, click, rub or gallop Abdomen: soft, non-tender;  bowel sounds normal; no masses,  no organomegaly Pelvic: cervix normal in appearance, external genitalia normal, no adnexal masses or tenderness, no cervical motion tenderness, uterus normal size, shape, and consistency and vagina normal without discharge Extremities: extremities normal, atraumatic, no cyanosis or edema Pulses: 2+ and symmetric Skin: Skin color, texture, turgor normal. No rashes or lesions Lymph nodes: Cervical, supraclavicular, and axillary nodes normal. Neurologic: Grossly normal    Assessment:    Healthy female exam.      Plan:     1. Encounter for routine gynecological examination Routine exam--normal pap with HPV negative 02/2015  2. Encounter for initial prescription of contraceptive pills Switch to COC since stopped breast feeding--planning on husband vasectomy - norethindrone-ethinyl estradiol (OVCON-35,BALZIVA,BRIELLYN) 0.4-35 MG-MCG tablet; Take 1 tablet by mouth daily.  Dispense: 1 Package; Refill: 11  See After Visit Summary for Counseling Recommendations

## 2016-03-13 NOTE — Patient Instructions (Addendum)
Preventive Care for Adults, Female A healthy lifestyle and preventive care can promote health and wellness. Preventive health guidelines for women include the following key practices. 1. A routine yearly physical is a good way to check with your health care provider about your health and preventive screening. It is a chance to share any concerns and updates on your health and to receive a thorough exam. 2. Visit your dentist for a routine exam and preventive care every 6 months. Brush your teeth twice a day and floss once a day. Good oral hygiene prevents tooth decay and gum disease. 3. The frequency of eye exams is based on your age, health, family medical history, use of contact lenses, and other factors. Follow your health care provider's recommendations for frequency of eye exams. 4. Eat a healthy diet. Foods like vegetables, fruits, whole grains, low-fat dairy products, and lean protein foods contain the nutrients you need without too many calories. Decrease your intake of foods high in solid fats, added sugars, and salt. Eat the right amount of calories for you.Get information about a proper diet from your health care provider, if necessary. 5. Regular physical exercise is one of the most important things you can do for your health. Most adults should get at least 150 minutes of moderate-intensity exercise (any activity that increases your heart rate and causes you to sweat) each week. In addition, most adults need muscle-strengthening exercises on 2 or more days a week. 6. Maintain a healthy weight. The body mass index (BMI) is a screening tool to identify possible weight problems. It provides an estimate of body fat based on height and weight. Your health care provider can find your BMI and can help you achieve or maintain a healthy weight.For adults 20 years and older: 1. A BMI below 18.5 is considered underweight. 2. A BMI of 18.5 to 24.9 is normal. 3. A BMI of 25 to 29.9 is considered  overweight. 4. A BMI of 30 and above is considered obese. 7. Maintain normal blood lipids and cholesterol levels by exercising and minimizing your intake of saturated fat. Eat a balanced diet with plenty of fruit and vegetables. Blood tests for lipids and cholesterol should begin at age 51 and be repeated every 5 years. If your lipid or cholesterol levels are high, you are over 50, or you are at high risk for heart disease, you may need your cholesterol levels checked more frequently.Ongoing high lipid and cholesterol levels should be treated with medicines if diet and exercise are not working. 8. If you smoke, find out from your health care provider how to quit. If you do not use tobacco, do not start. 9. Lung cancer screening is recommended for adults aged 74-80 years who are at high risk for developing lung cancer because of a history of smoking. A yearly low-dose CT scan of the lungs is recommended for people who have at least a 30-pack-year history of smoking and are a current smoker or have quit within the past 15 years. A pack year of smoking is smoking an average of 1 pack of cigarettes a day for 1 year (for example: 1 pack a day for 30 years or 2 packs a day for 15 years). Yearly screening should continue until the smoker has stopped smoking for at least 15 years. Yearly screening should be stopped for people who develop a health problem that would prevent them from having lung cancer treatment. 10. If you are pregnant, do not drink alcohol. If you are  breastfeeding, be very cautious about drinking alcohol. If you are not pregnant and choose to drink alcohol, do not have more than 1 drink per day. One drink is considered to be 12 ounces (355 mL) of beer, 5 ounces (148 mL) of wine, or 1.5 ounces (44 mL) of liquor. 11. Avoid use of street drugs. Do not share needles with anyone. Ask for help if you need support or instructions about stopping the use of drugs. 12. High blood pressure causes heart  disease and increases the risk of stroke. Your blood pressure should be checked at least every 1 to 2 years. Ongoing high blood pressure should be treated with medicines if weight loss and exercise do not work. 43. If you are 12-94 years old, ask your health care provider if you should take aspirin to prevent strokes. 14. Diabetes screening is done by taking a blood sample to check your blood glucose level after you have not eaten for a certain period of time (fasting). If you are not overweight and you do not have risk factors for diabetes, you should be screened once every 3 years starting at age 25. If you are overweight or obese and you are 100-44 years of age, you should be screened for diabetes every year as part of your cardiovascular risk assessment. 15. Breast cancer screening is essential preventive care for women. You should practice "breast self-awareness." This means understanding the normal appearance and feel of your breasts and may include breast self-examination. Any changes detected, no matter how small, should be reported to a health care provider. Women in their 72s and 30s should have a clinical breast exam (CBE) by a health care provider as part of a regular health exam every 1 to 3 years. After age 71, women should have a CBE every year. Starting at age 89, women should consider having a mammogram (breast X-ray test) every year. Women who have a family history of breast cancer should talk to their health care provider about genetic screening. Women at a high risk of breast cancer should talk to their health care providers about having an MRI and a mammogram every year. 16. Breast cancer gene (BRCA)-related cancer risk assessment is recommended for women who have family members with BRCA-related cancers. BRCA-related cancers include breast, ovarian, tubal, and peritoneal cancers. Having family members with these cancers may be associated with an increased risk for harmful changes (mutations)  in the breast cancer genes BRCA1 and BRCA2. Results of the assessment will determine the need for genetic counseling and BRCA1 and BRCA2 testing. 45. Your health care provider may recommend that you be screened regularly for cancer of the pelvic organs (ovaries, uterus, and vagina). This screening involves a pelvic examination, including checking for microscopic changes to the surface of your cervix (Pap test). You may be encouraged to have this screening done every 3 years, beginning at age 26. 33. For women ages 62-65, health care providers may recommend pelvic exams and Pap testing every 3 years, or they may recommend the Pap and pelvic exam, combined with testing for human papilloma virus (HPV), every 5 years. Some types of HPV increase your risk of cervical cancer. Testing for HPV may also be done on women of any age with unclear Pap test results. 2. Other health care providers may not recommend any screening for nonpregnant women who are considered low risk for pelvic cancer and who do not have symptoms. Ask your health care provider if a screening pelvic exam is right for  you. 18. If you have had past treatment for cervical cancer or a condition that could lead to cancer, you need Pap tests and screening for cancer for at least 20 years after your treatment. If Pap tests have been discontinued, your risk factors (such as having a new sexual partner) need to be reassessed to determine if screening should resume. Some women have medical problems that increase the chance of getting cervical cancer. In these cases, your health care provider may recommend more frequent screening and Pap tests. 19. Colorectal cancer can be detected and often prevented. Most routine colorectal cancer screening begins at the age of 27 years and continues through age 13 years. However, your health care provider may recommend screening at an earlier age if you have risk factors for colon cancer. On a yearly basis, your health care  provider may provide home test kits to check for hidden blood in the stool. Use of a small camera at the end of a tube, to directly examine the colon (sigmoidoscopy or colonoscopy), can detect the earliest forms of colorectal cancer. Talk to your health care provider about this at age 60, when routine screening begins. Direct exam of the colon should be repeated every 5-10 years through age 69 years, unless early forms of precancerous polyps or small growths are found. 20. People who are at an increased risk for hepatitis B should be screened for this virus. You are considered at high risk for hepatitis B if: 1. You were born in a country where hepatitis B occurs often. Talk with your health care provider about which countries are considered high risk. 2. Your parents were born in a high-risk country and you have not received a shot to protect against hepatitis B (hepatitis B vaccine). 3. You have HIV or AIDS. 4. You use needles to inject street drugs. 5. You live with, or have sex with, someone who has hepatitis B. 6. You get hemodialysis treatment. 7. You take certain medicines for conditions like cancer, organ transplantation, and autoimmune conditions. 21. Hepatitis C blood testing is recommended for all people born from [birth year] through 1965 and any individual with known risks for hepatitis C. 22. Practice safe sex. Use condoms and avoid high-risk sexual practices to reduce the spread of sexually transmitted infections (STIs). STIs include gonorrhea, chlamydia, syphilis, trichomonas, herpes, HPV, and human immunodeficiency virus (HIV). Herpes, HIV, and HPV are viral illnesses that have no cure. They can result in disability, cancer, and death. 23. You should be screened for sexually transmitted illnesses (STIs) including gonorrhea and chlamydia if: 1. You are sexually active and are younger than 24 years. 2. You are older than 24 years and your health care provider tells you that you are at risk for  this type of infection. 3. Your sexual activity has changed since you were last screened and you are at an increased risk for chlamydia or gonorrhea. Ask your health care provider if you are at risk. 24. If you are at risk of being infected with HIV, it is recommended that you take a prescription medicine daily to prevent HIV infection. This is called preexposure prophylaxis (PrEP). You are considered at risk if: 1. You are sexually active and do not regularly use condoms or know the HIV status of your partner(s). 2. You take drugs by injection. 3. You are sexually active with a partner who has HIV. 4. Talk with your health care provider about whether you are at high risk of being infected with HIV. If  you choose to begin PrEP, you should first be tested for HIV. You should then be tested every 3 months for as long as you are taking PrEP. 25. Osteoporosis is a disease in which the bones lose minerals and strength with aging. This can result in serious bone fractures or breaks. The risk of osteoporosis can be identified using a bone density scan. Women ages 76 years and over and women at risk for fractures or osteoporosis should discuss screening with their health care providers. Ask your health care provider whether you should take a calcium supplement or vitamin D to reduce the rate of osteoporosis. 26. Menopause can be associated with physical symptoms and risks. Hormone replacement therapy is available to decrease symptoms and risks. You should talk to your health care provider about whether hormone replacement therapy is right for you. 27. Use sunscreen. Apply sunscreen liberally and repeatedly throughout the day. You should seek shade when your shadow is shorter than you. Protect yourself by wearing long sleeves, pants, a wide-brimmed hat, and sunglasses year round, whenever you are outdoors. 28. Once a month, do a whole body skin exam, using a mirror to look at the skin on your back. Tell your health  care provider of new moles, moles that have irregular borders, moles that are larger than a pencil eraser, or moles that have changed in shape or color. 29. Stay current with required vaccines (immunizations). 1. Influenza vaccine. All adults should be immunized every year. 2. Tetanus, diphtheria, and acellular pertussis (Td, Tdap) vaccine. Pregnant women should receive 1 dose of Tdap vaccine during each pregnancy. The dose should be obtained regardless of the length of time since the last dose. Immunization is preferred during the 27th-36th week of gestation. An adult who has not previously received Tdap or who does not know her vaccine status should receive 1 dose of Tdap. This initial dose should be followed by tetanus and diphtheria toxoids (Td) booster doses every 10 years. Adults with an unknown or incomplete history of completing a 3-dose immunization series with Td-containing vaccines should begin or complete a primary immunization series including a Tdap dose. Adults should receive a Td booster every 10 years. 3. Varicella vaccine. An adult without evidence of immunity to varicella should receive 2 doses or a second dose if she has previously received 1 dose. Pregnant females who do not have evidence of immunity should receive the first dose after pregnancy. This first dose should be obtained before leaving the health care facility. The second dose should be obtained 4-8 weeks after the first dose. 4. Human papillomavirus (HPV) vaccine. Females aged 13-26 years who have not received the vaccine previously should obtain the 3-dose series. The vaccine is not recommended for use in pregnant females. However, pregnancy testing is not needed before receiving a dose. If a female is found to be pregnant after receiving a dose, no treatment is needed. In that case, the remaining doses should be delayed until after the pregnancy. Immunization is recommended for any person with an immunocompromised condition  through the age of 55 years if she did not get any or all doses earlier. During the 3-dose series, the second dose should be obtained 4-8 weeks after the first dose. The third dose should be obtained 24 weeks after the first dose and 16 weeks after the second dose. 5. Zoster vaccine. One dose is recommended for adults aged 60 years or older unless certain conditions are present. 6. Measles, mumps, and rubella (MMR) vaccine. Adults born  before 1957 generally are considered immune to measles and mumps. Adults born in [birth year] or later should have 1 or more doses of MMR vaccine unless there is a contraindication to the vaccine or there is laboratory evidence of immunity to each of the three diseases. A routine second dose of MMR vaccine should be obtained at least 28 days after the first dose for students attending postsecondary schools, health care workers, or international travelers. People who received inactivated measles vaccine or an unknown type of measles vaccine during 1963-1967 should receive 2 doses of MMR vaccine. People who received inactivated mumps vaccine or an unknown type of mumps vaccine before 1979 and are at high risk for mumps infection should consider immunization with 2 doses of MMR vaccine. For females of childbearing age, rubella immunity should be determined. If there is no evidence of immunity, females who are not pregnant should be vaccinated. If there is no evidence of immunity, females who are pregnant should delay immunization until after pregnancy. Unvaccinated health care workers born before [birth year] who lack laboratory evidence of measles, mumps, or rubella immunity or laboratory confirmation of disease should consider measles and mumps immunization with 2 doses of MMR vaccine or rubella immunization with 1 dose of MMR vaccine. 7. Pneumococcal 13-valent conjugate (PCV13) vaccine. When indicated, a person who is uncertain of his immunization history and has no record of immunization should  receive the PCV13 vaccine. All adults 66 years of age and older should receive this vaccine. An adult aged 31 years or older who has certain medical conditions and has not been previously immunized should receive 1 dose of PCV13 vaccine. This PCV13 should be followed with a dose of pneumococcal polysaccharide (PPSV23) vaccine. Adults who are at high risk for pneumococcal disease should obtain the PPSV23 vaccine at least 8 weeks after the dose of PCV13 vaccine. Adults older than 37 years of age who have normal immune system function should obtain the PPSV23 vaccine dose at least 1 year after the dose of PCV13 vaccine. 8. Pneumococcal polysaccharide (PPSV23) vaccine. When PCV13 is also indicated, PCV13 should be obtained first. All adults aged 43 years and older should be immunized. An adult younger than age 81 years who has certain medical conditions should be immunized. Any person who resides in a nursing home or long-term care facility should be immunized. An adult smoker should be immunized. People with an immunocompromised condition and certain other conditions should receive both PCV13 and PPSV23 vaccines. People with human immunodeficiency virus (HIV) infection should be immunized as soon as possible after diagnosis. Immunization during chemotherapy or radiation therapy should be avoided. Routine use of PPSV23 vaccine is not recommended for American Indians, [place] Natives, or people younger than 65 years unless there are medical conditions that require PPSV23 vaccine. When indicated, people who have unknown immunization and have no record of immunization should receive PPSV23 vaccine. One-time revaccination 5 years after the first dose of PPSV23 is recommended for people aged 19-64 years who have chronic kidney failure, nephrotic syndrome, asplenia, or immunocompromised conditions. People who received 1-2 doses of PPSV23 before age 57 years should receive another dose of PPSV23 vaccine at age 56 years or  later if at least 5 years have passed since the previous dose. Doses of PPSV23 are not needed for people immunized with PPSV23 at or after age 41 years. 9. Meningococcal vaccine. Adults with asplenia or persistent complement component deficiencies should receive 2 doses of quadrivalent meningococcal conjugate (MenACWY-D) vaccine. The doses should be obtained  at least 2 months apart. Microbiologists working with certain meningococcal bacteria, [employer] recruits, people at risk during an outbreak, and people who travel to or live in countries with a high rate of meningitis should be immunized. A first-year college student up through age 43 years who is living in a residence hall should receive a dose if she did not receive a dose on or after her 16th birthday. Adults who have certain high-risk conditions should receive one or more doses of vaccine. 10. Hepatitis A vaccine. Adults who wish to be protected from this disease, have certain high-risk conditions, work with hepatitis A-infected animals, work in hepatitis A research labs, or travel to or work in countries with a high rate of hepatitis A should be immunized. Adults who were previously unvaccinated and who anticipate close contact with an international adoptee during the first 60 days after arrival in the [place] from a country with a high rate of hepatitis A should be immunized. 11. Hepatitis B vaccine. Adults who wish to be protected from this disease, have certain high-risk conditions, may be exposed to blood or other infectious body fluids, are household contacts or sex partners of hepatitis B positive people, are clients or workers in certain care facilities, or travel to or work in countries with a high rate of hepatitis B should be immunized. 12. Haemophilus influenzae type b (Hib) vaccine. A previously unvaccinated person with asplenia or sickle cell disease or having a scheduled splenectomy should receive 1 dose of Hib vaccine. Regardless of  previous immunization, a recipient of a hematopoietic stem cell transplant should receive a 3-dose series 6-12 months after her successful transplant. Hib vaccine is not recommended for adults with HIV infection. Preventive Services / Frequency Ages 34 to 75 years  Blood pressure check.** / Every 3-5 years.  Lipid and cholesterol check.** / Every 5 years beginning at age 58.  Clinical breast exam.** / Every 3 years for women in their 60s and 26s.  BRCA-related cancer risk assessment.** / For women who have family members with a BRCA-related cancer (breast, ovarian, tubal, or peritoneal cancers).  Pap test.** / Every 2 years from ages 15 through 71. Every 3 years starting at age 51 through age 37 or 29 with a history of 3 consecutive normal Pap tests.  HPV screening.** / Every 3 years from ages 55 through ages 31 to 60 with a history of 3 consecutive normal Pap tests.  Hepatitis C blood test.** / For any individual with known risks for hepatitis C.  Skin self-exam. / Monthly.  Influenza vaccine. / Every year.  Tetanus, diphtheria, and acellular pertussis (Tdap, Td) vaccine.** / Consult your health care provider. Pregnant women should receive 1 dose of Tdap vaccine during each pregnancy. 1 dose of Td every 10 years.  Varicella vaccine.** / Consult your health care provider. Pregnant females who do not have evidence of immunity should receive the first dose after pregnancy.  HPV vaccine. / 3 doses over 6 months, if 59 and younger. The vaccine is not recommended for use in pregnant females. However, pregnancy testing is not needed before receiving a dose.  Measles, mumps, rubella (MMR) vaccine.** / You need at least 1 dose of MMR if you were born in 1957 or later. You may also need a 2nd dose. For females of childbearing age, rubella immunity should be determined. If there is no evidence of immunity, females who are not pregnant should be vaccinated. If there is no evidence of immunity,  females who  are pregnant should delay immunization until after pregnancy.  Pneumococcal 13-valent conjugate (PCV13) vaccine.** / Consult your health care provider.  Pneumococcal polysaccharide (PPSV23) vaccine.** / 1 to 2 doses if you smoke cigarettes or if you have certain conditions.  Meningococcal vaccine.** / 1 dose if you are age 33 to 39 years and a Orthoptist living in a residence hall, or have one of several medical conditions, you need to get vaccinated against meningococcal disease. You may also need additional booster doses.  Hepatitis A vaccine.** / Consult your health care provider.  Hepatitis B vaccine.** / Consult your health care provider.  Haemophilus influenzae type b (Hib) vaccine.** / Consult your health care provider. Ages 58 to 64 years  Blood pressure check.** / Every year.  Lipid and cholesterol check.** / Every 5 years beginning at age 50 years.  Lung cancer screening. / Every year if you are aged 55-80 years and have a 30-pack-year history of smoking and currently smoke or have quit within the past 15 years. Yearly screening is stopped once you have quit smoking for at least 15 years or develop a health problem that would prevent you from having lung cancer treatment.  Clinical breast exam.** / Every year after age 67 years.  BRCA-related cancer risk assessment.** / For women who have family members with a BRCA-related cancer (breast, ovarian, tubal, or peritoneal cancers).  Mammogram.** / Every year beginning at age 66 years and continuing for as long as you are in good health. Consult with your health care provider.  Pap test.** / Every 3 years starting at age 30 years through age 39 or 67 years with a history of 3 consecutive normal Pap tests.  HPV screening.** / Every 3 years from ages 40 years through ages 79 to 32 years with a history of 3 consecutive normal Pap tests.  Fecal occult blood test (FOBT) of stool. / Every year beginning at  age 90 years and continuing until age 70 years. You may not need to do this test if you get a colonoscopy every 10 years.  Flexible sigmoidoscopy or colonoscopy.** / Every 5 years for a flexible sigmoidoscopy or every 10 years for a colonoscopy beginning at age 19 years and continuing until age 56 years.  Hepatitis C blood test.** / For all people born from [birth year] through 1967 and any individual with known risks for hepatitis C.  Skin self-exam. / Monthly.  Influenza vaccine. / Every year.  Tetanus, diphtheria, and acellular pertussis (Tdap/Td) vaccine.** / Consult your health care provider. Pregnant women should receive 1 dose of Tdap vaccine during each pregnancy. 1 dose of Td every 10 years.  Varicella vaccine.** / Consult your health care provider. Pregnant females who do not have evidence of immunity should receive the first dose after pregnancy.  Zoster vaccine.** / 1 dose for adults aged 24 years or older.  Measles, mumps, rubella (MMR) vaccine.** / You need at least 1 dose of MMR if you were born in 1957 or later. You may also need a second dose. For females of childbearing age, rubella immunity should be determined. If there is no evidence of immunity, females who are not pregnant should be vaccinated. If there is no evidence of immunity, females who are pregnant should delay immunization until after pregnancy.  Pneumococcal 13-valent conjugate (PCV13) vaccine.** / Consult your health care provider.  Pneumococcal polysaccharide (PPSV23) vaccine.** / 1 to 2 doses if you smoke cigarettes or if you have certain conditions.  Meningococcal vaccine.** /  Consult your health care provider.  Hepatitis A vaccine.** / Consult your health care provider.  Hepatitis B vaccine.** / Consult your health care provider.  Haemophilus influenzae type b (Hib) vaccine.** / Consult your health care provider. Ages 15 years and over  Blood pressure check.** / Every year.  Lipid and cholesterol check.**  / Every 5 years beginning at age 31 years.  Lung cancer screening. / Every year if you are aged 96-80 years and have a 30-pack-year history of smoking and currently smoke or have quit within the past 15 years. Yearly screening is stopped once you have quit smoking for at least 15 years or develop a health problem that would prevent you from having lung cancer treatment.  Clinical breast exam.** / Every year after age 60 years.  BRCA-related cancer risk assessment.** / For women who have family members with a BRCA-related cancer (breast, ovarian, tubal, or peritoneal cancers).  Mammogram.** / Every year beginning at age 65 years and continuing for as long as you are in good health. Consult with your health care provider.  Pap test.** / Every 3 years starting at age 64 years through age 55 or 13 years with 3 consecutive normal Pap tests. Testing can be stopped between 65 and 70 years with 3 consecutive normal Pap tests and no abnormal Pap or HPV tests in the past 10 years.  HPV screening.** / Every 3 years from ages 73 years through ages 64 or 8 years with a history of 3 consecutive normal Pap tests. Testing can be stopped between 65 and 70 years with 3 consecutive normal Pap tests and no abnormal Pap or HPV tests in the past 10 years.  Fecal occult blood test (FOBT) of stool. / Every year beginning at age 63 years and continuing until age 34 years. You may not need to do this test if you get a colonoscopy every 10 years.  Flexible sigmoidoscopy or colonoscopy.** / Every 5 years for a flexible sigmoidoscopy or every 10 years for a colonoscopy beginning at age 99 years and continuing until age 1 years.  Hepatitis C blood test.** / For all people born from [birth year] through 1967 and any individual with known risks for hepatitis C.  Osteoporosis screening.** / A one-time screening for women ages 59 years and over and women at risk for fractures or osteoporosis.  Skin self-exam. / Monthly.  Influenza  vaccine. / Every year.  Tetanus, diphtheria, and acellular pertussis (Tdap/Td) vaccine.** / 1 dose of Td every 10 years.  Varicella vaccine.** / Consult your health care provider.  Zoster vaccine.** / 1 dose for adults aged 68 years or older.  Pneumococcal 13-valent conjugate (PCV13) vaccine.** / Consult your health care provider.  Pneumococcal polysaccharide (PPSV23) vaccine.** / 1 dose for all adults aged 83 years and older.  Meningococcal vaccine.** / Consult your health care provider.  Hepatitis A vaccine.** / Consult your health care provider.  Hepatitis B vaccine.** / Consult your health care provider.  Haemophilus influenzae type b (Hib) vaccine.** / Consult your health care provider. ** Family history and personal history of risk and conditions may change your health care provider's recommendations.   This information is not intended to replace advice given to you by your health care provider. Make sure you discuss any questions you have with your health care provider.   Document Released: 12/29/2001 Document Revised: 11/23/2014 Document Reviewed: 03/30/2011 Elsevier Interactive Patient Education 2016 [employer]. Kegel Exercises The goal of Kegel exercises is to isolate and exercise  your pelvic floor muscles. These muscles act as a hammock that supports the rectum, vagina, small intestine, and uterus. As the muscles weaken, the hammock sags and these organs are displaced from their normal positions. Kegel exercises can strengthen your pelvic floor muscles and help you to improve bladder and bowel control, improve sexual response, and help reduce many problems and some discomfort during pregnancy. Kegel exercises can be done anywhere and at any time. HOW TO PERFORM KEGEL EXERCISES 30. Locate your pelvic floor muscles. To do this, squeeze (contract) the muscles that you use when you try to stop the flow of urine. You will feel a tightness in the vaginal area (women) and a tight  lift in the rectal area (men and women). 31. When you begin, contract your pelvic muscles tight for 2-5 seconds, then relax them for 2-5 seconds. This is one set. Do 4-5 sets with a short pause in between. 32. Contract your pelvic muscles for 8-10 seconds, then relax them for 8-10 seconds. Do 4-5 sets. If you cannot contract your pelvic muscles for 8-10 seconds, try 5-7 seconds and work your way up to 8-10 seconds. Your goal is 4-5 sets of 10 contractions each day. Keep your stomach, buttocks, and legs relaxed during the exercises. Perform sets of both short and long contractions. Vary your positions. Perform these contractions 3-4 times per day. Perform sets while you are:   Lying in bed in the morning.  Standing at lunch.  Sitting in the late afternoon.  Lying in bed at night. You should do 40-50 contractions per day. Do not perform more Kegel exercises per day than recommended. Overexercising can cause muscle fatigue. Continue these exercises for for at least 15-20 weeks or as directed by your caregiver.   This information is not intended to replace advice given to you by your health care provider. Make sure you discuss any questions you have with your health care provider.   Document Released: 10/19/2012 Document Revised: 11/23/2014 Document Reviewed: 10/19/2012 Elsevier Interactive Patient Education [employer].

## 2016-03-16 IMAGING — US US OB TRANSVAGINAL
1 series · 14 of 28 positions shown · non-contrast
Comparison: 04/07/2014.

CLINICAL DATA: Confirm viability. Suspected pregnancy, but no beta
HCG level is available.

EXAM:
TRANSVAGINAL OB ULTRASOUND; OBSTETRIC <14 WK ULTRASOUND
TECHNIQUE: Transvaginal ultrasound was performed for complete evaluation of the
gestation as well as the maternal uterus, adnexal regions, and
pelvic cul-de-sac.

[Series 1: us ob comp less 14 wks · 48 acquisitions, 14 frames shown]
[im 2/48]
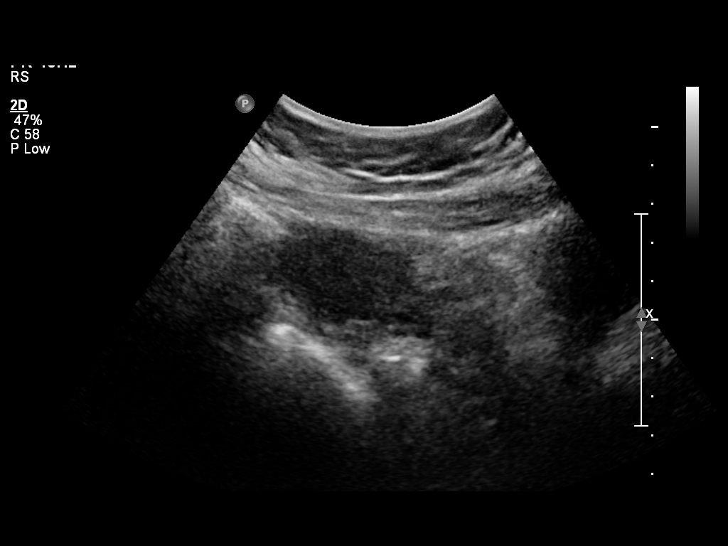
[im 6/48]
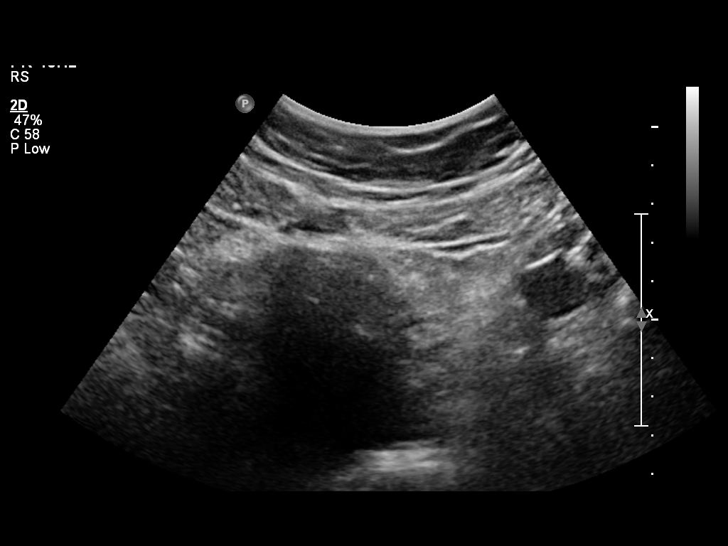
[im 9/48]
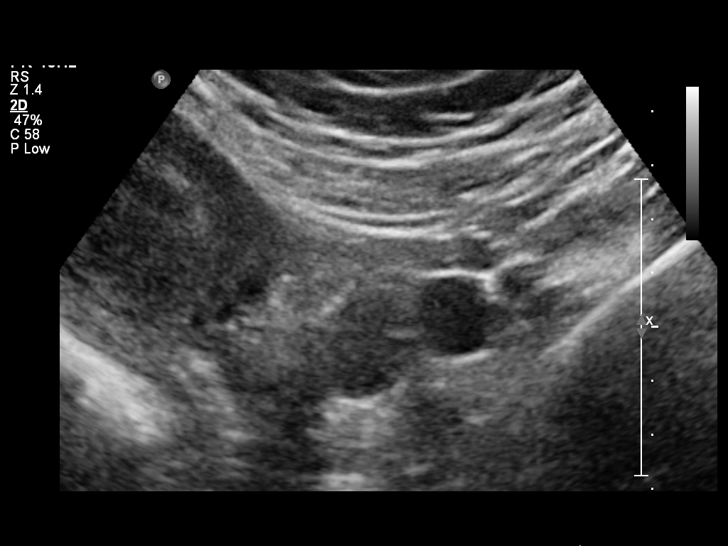
[im 13/48]
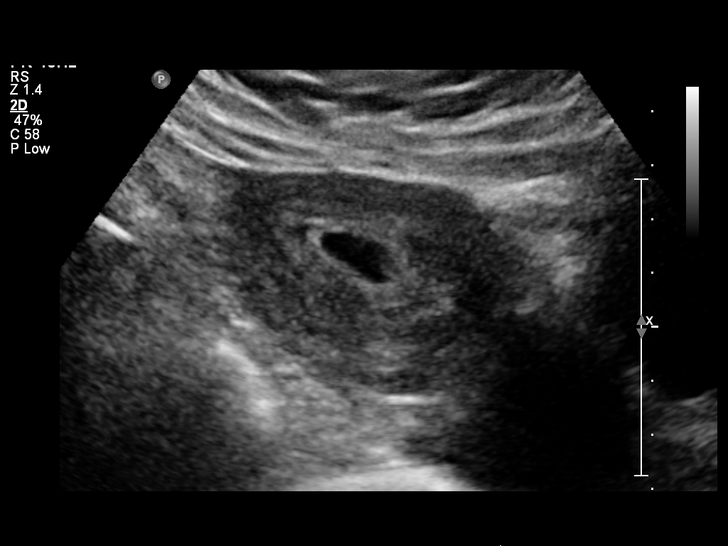
[im 16/48]
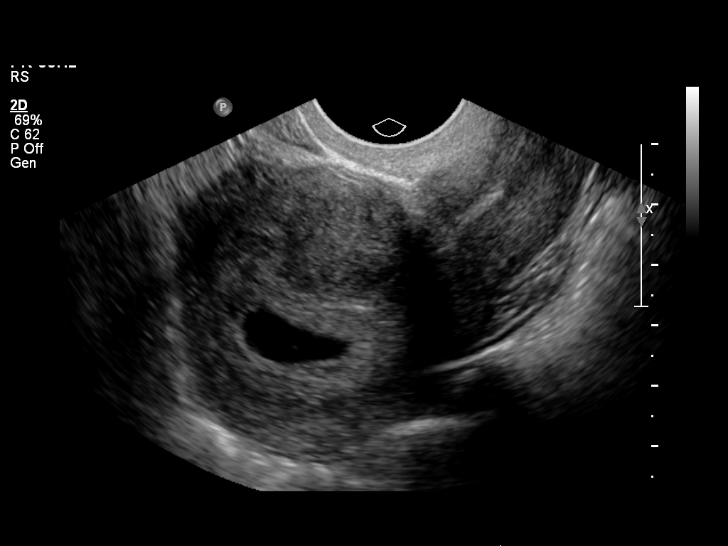
[im 20/48]
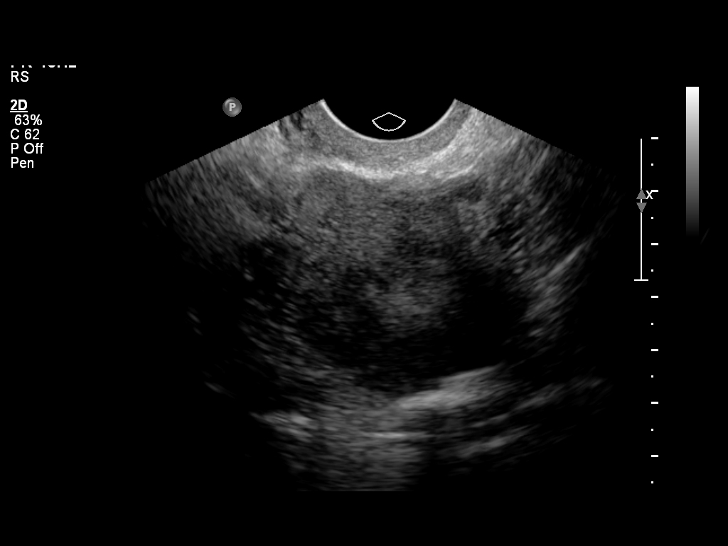
[im 23/48]
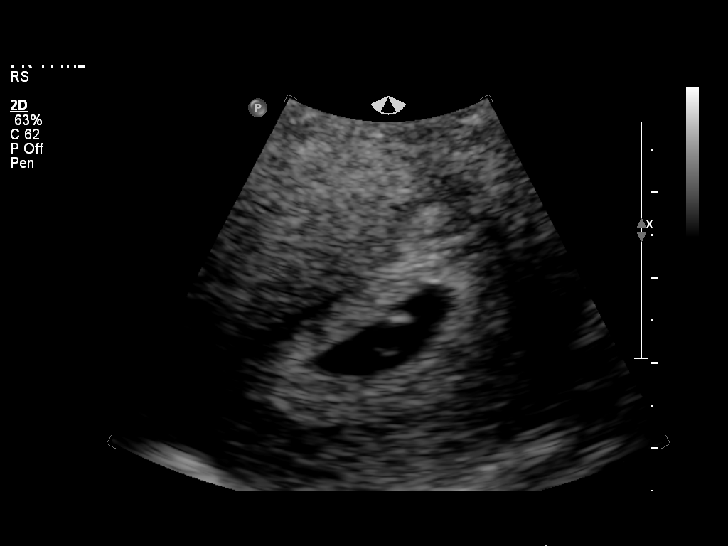
[im 27/48]
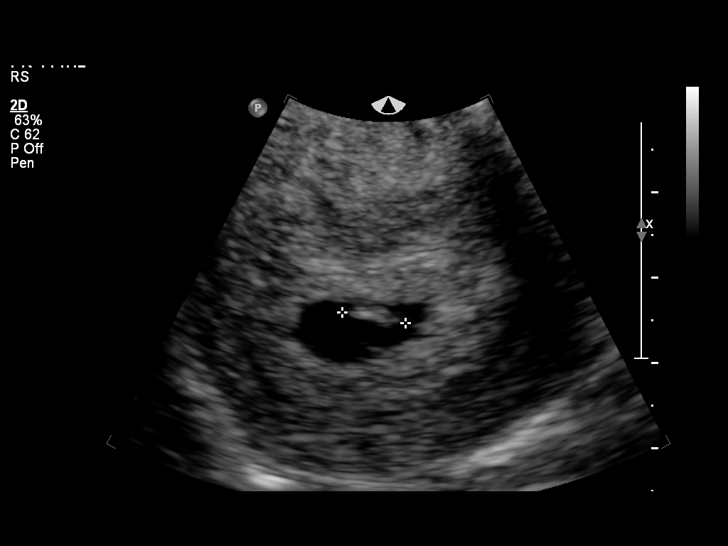
[im 30/48]
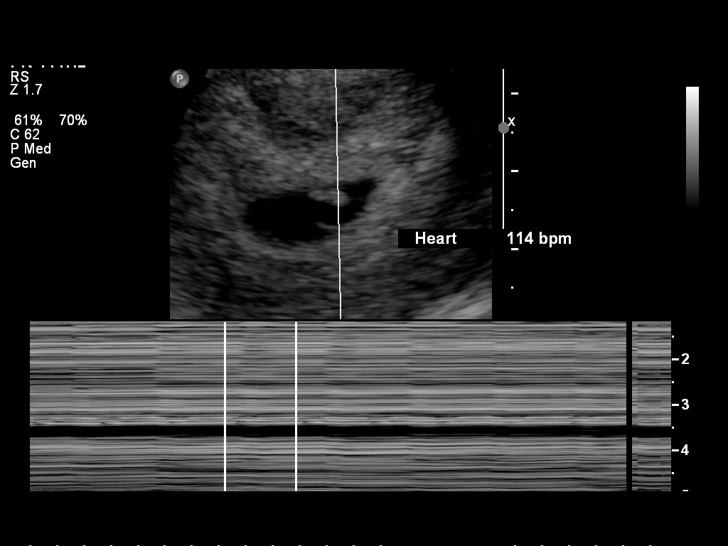
[im 34/48]
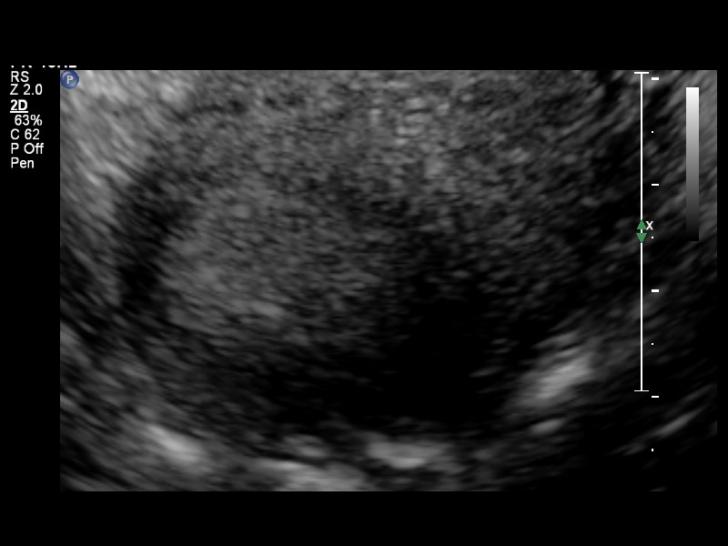
[im 37/48]
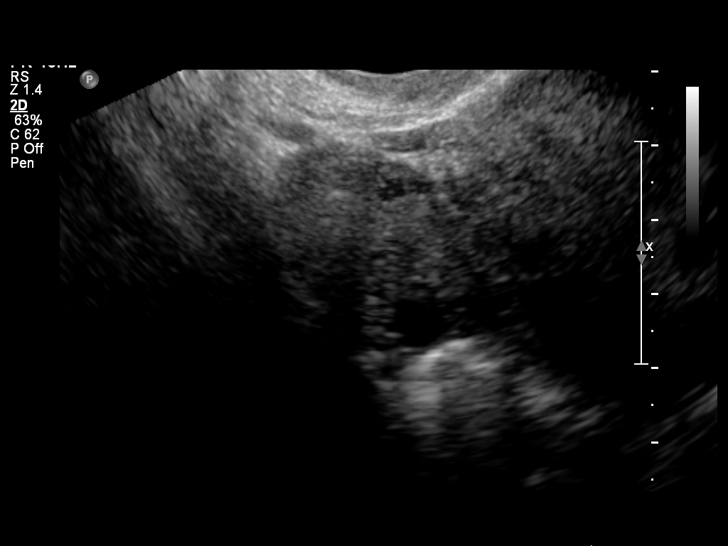
[im 41/48]
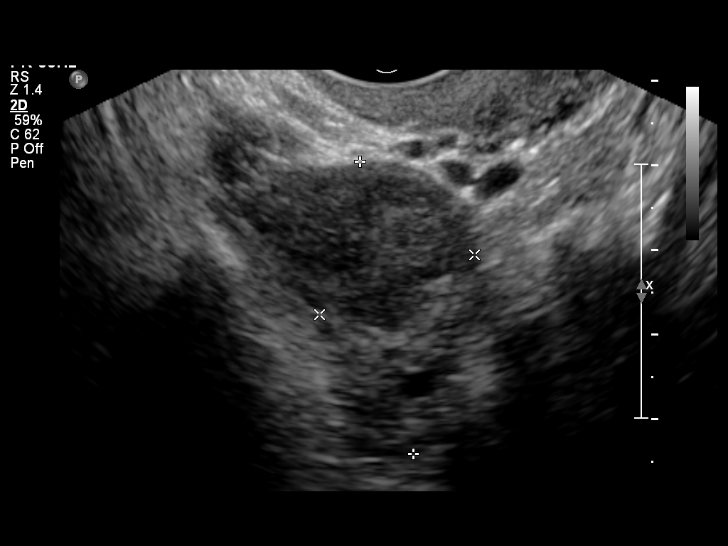
[im 44/48]
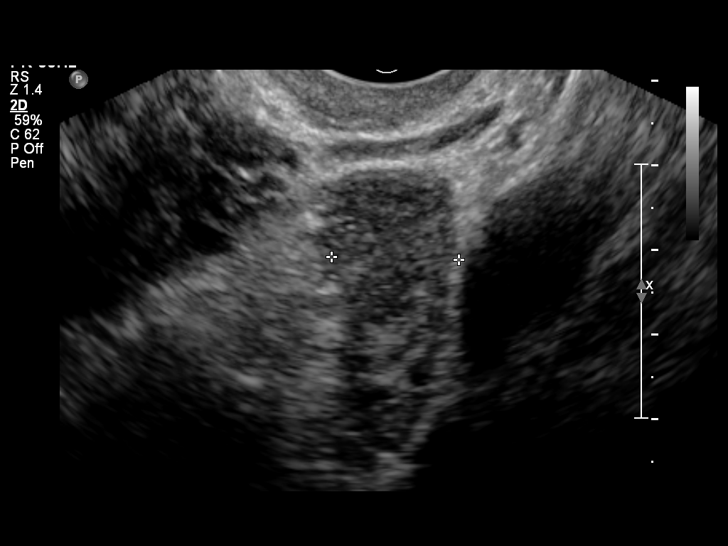
[im 48/48]
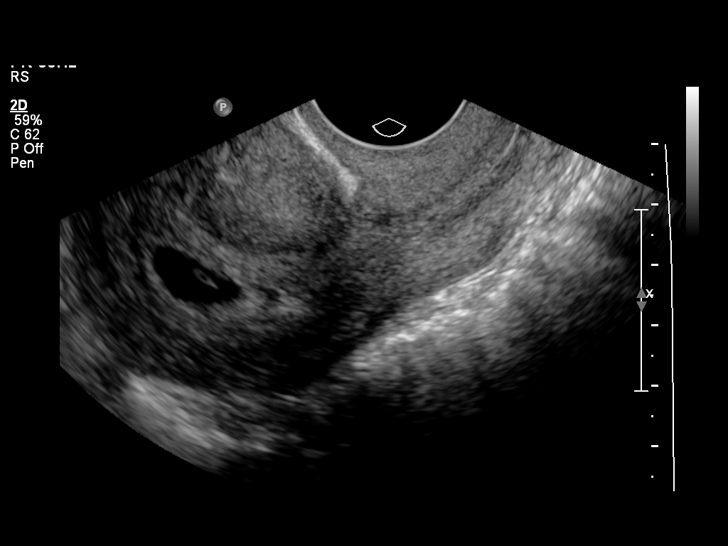

[14 of 28 positions shown; findings below may reference images not displayed]

FINDINGS: Intrauterine gestational sac: Single, normal in shape.

Yolk sac:  Present.

Embryo:  Present.

Cardiac Activity: Present.

Heart Rate: 114 bpm

CRL:   7.3  mm   6 w 5 d                  US EDC: 01/09/2014

Maternal uterus/adnexae: No subchorionic hemorrhage. Normal right
ovary. Small corpus luteum cyst left ovary. No free fluid.
IMPRESSION: Single viable intrauterine pregnancy with an estimated gestational
age of 6 weeks and 5 days.

## 2016-06-01 DIAGNOSIS — M5414 Radiculopathy, thoracic region: Secondary | ICD-10-CM | POA: Diagnosis not present

## 2016-06-01 DIAGNOSIS — M542 Cervicalgia: Secondary | ICD-10-CM | POA: Diagnosis not present

## 2016-06-16 DIAGNOSIS — J3081 Allergic rhinitis due to animal (cat) (dog) hair and dander: Secondary | ICD-10-CM | POA: Diagnosis not present

## 2016-06-16 DIAGNOSIS — J3089 Other allergic rhinitis: Secondary | ICD-10-CM | POA: Diagnosis not present

## 2016-06-17 DIAGNOSIS — Z1231 Encounter for screening mammogram for malignant neoplasm of breast: Secondary | ICD-10-CM | POA: Diagnosis not present

## 2016-06-17 DIAGNOSIS — Z803 Family history of malignant neoplasm of breast: Secondary | ICD-10-CM | POA: Diagnosis not present

## 2016-06-18 ENCOUNTER — Encounter: Payer: Self-pay | Admitting: *Deleted

## 2016-06-18 DIAGNOSIS — R152 Fecal urgency: Secondary | ICD-10-CM | POA: Diagnosis not present

## 2016-06-18 DIAGNOSIS — E669 Obesity, unspecified: Secondary | ICD-10-CM | POA: Diagnosis not present

## 2016-06-18 DIAGNOSIS — R197 Diarrhea, unspecified: Secondary | ICD-10-CM | POA: Diagnosis not present

## 2016-06-30 DIAGNOSIS — M79672 Pain in left foot: Secondary | ICD-10-CM | POA: Diagnosis not present

## 2016-06-30 DIAGNOSIS — B351 Tinea unguium: Secondary | ICD-10-CM | POA: Diagnosis not present

## 2016-06-30 DIAGNOSIS — M79671 Pain in right foot: Secondary | ICD-10-CM | POA: Diagnosis not present

## 2016-07-16 DIAGNOSIS — Z8371 Family history of colonic polyps: Secondary | ICD-10-CM | POA: Diagnosis not present

## 2016-07-16 DIAGNOSIS — Z1211 Encounter for screening for malignant neoplasm of colon: Secondary | ICD-10-CM | POA: Diagnosis not present

## 2016-07-16 DIAGNOSIS — R194 Change in bowel habit: Secondary | ICD-10-CM | POA: Diagnosis not present

## 2016-08-24 DIAGNOSIS — Z8371 Family history of colonic polyps: Secondary | ICD-10-CM | POA: Diagnosis not present

## 2016-08-24 DIAGNOSIS — R194 Change in bowel habit: Secondary | ICD-10-CM | POA: Diagnosis not present

## 2016-08-24 DIAGNOSIS — K6389 Other specified diseases of intestine: Secondary | ICD-10-CM | POA: Diagnosis not present

## 2016-08-24 DIAGNOSIS — K635 Polyp of colon: Secondary | ICD-10-CM | POA: Diagnosis not present

## 2016-08-24 DIAGNOSIS — Z1211 Encounter for screening for malignant neoplasm of colon: Secondary | ICD-10-CM | POA: Diagnosis not present

## 2016-08-24 DIAGNOSIS — D120 Benign neoplasm of cecum: Secondary | ICD-10-CM | POA: Diagnosis not present

## 2016-08-27 DIAGNOSIS — M5414 Radiculopathy, thoracic region: Secondary | ICD-10-CM | POA: Diagnosis not present

## 2016-08-27 DIAGNOSIS — M542 Cervicalgia: Secondary | ICD-10-CM | POA: Diagnosis not present

## 2016-09-18 DIAGNOSIS — J301 Allergic rhinitis due to pollen: Secondary | ICD-10-CM | POA: Diagnosis not present

## 2016-09-18 DIAGNOSIS — J3089 Other allergic rhinitis: Secondary | ICD-10-CM | POA: Diagnosis not present

## 2016-09-23 ENCOUNTER — Encounter: Payer: Self-pay | Admitting: *Deleted

## 2016-10-15 DIAGNOSIS — H5213 Myopia, bilateral: Secondary | ICD-10-CM | POA: Diagnosis not present

## 2016-12-07 DIAGNOSIS — J301 Allergic rhinitis due to pollen: Secondary | ICD-10-CM | POA: Diagnosis not present

## 2016-12-08 ENCOUNTER — Emergency Department (HOSPITAL_COMMUNITY)
Admission: EM | Admit: 2016-12-08 | Discharge: 2016-12-08 | Disposition: A | Discharge status: Home / Self Care | Payer: BLUE CROSS/BLUE SHIELD | Attending: Emergency Medicine | Admitting: Emergency Medicine

## 2016-12-08 ENCOUNTER — Emergency Department (HOSPITAL_COMMUNITY): Payer: BLUE CROSS/BLUE SHIELD

## 2016-12-08 ENCOUNTER — Encounter (HOSPITAL_COMMUNITY): Payer: Self-pay | Admitting: Emergency Medicine

## 2016-12-08 DIAGNOSIS — J111 Influenza due to unidentified influenza virus with other respiratory manifestations: Secondary | ICD-10-CM | POA: Diagnosis not present

## 2016-12-08 DIAGNOSIS — J181 Lobar pneumonia, unspecified organism: Secondary | ICD-10-CM | POA: Insufficient documentation

## 2016-12-08 DIAGNOSIS — E039 Hypothyroidism, unspecified: Secondary | ICD-10-CM | POA: Diagnosis not present

## 2016-12-08 DIAGNOSIS — R05 Cough: Secondary | ICD-10-CM | POA: Diagnosis present

## 2016-12-08 DIAGNOSIS — R509 Fever, unspecified: Secondary | ICD-10-CM | POA: Diagnosis not present

## 2016-12-08 DIAGNOSIS — J1108 Influenza due to unidentified influenza virus with specified pneumonia: Secondary | ICD-10-CM | POA: Diagnosis not present

## 2016-12-08 DIAGNOSIS — J189 Pneumonia, unspecified organism: Secondary | ICD-10-CM

## 2016-12-08 DIAGNOSIS — J9811 Atelectasis: Secondary | ICD-10-CM | POA: Diagnosis not present

## 2016-12-08 LAB — COMPREHENSIVE METABOLIC PANEL
ALBUMIN: 3.9 g/dL (ref 3.5–5.0)
ALK PHOS: 66 U/L (ref 38–126)
ALT: 20 U/L (ref 14–54)
AST: 22 U/L (ref 15–41)
Anion gap: 8 (ref 5–15)
BUN: 6 mg/dL (ref 6–20)
CO2: 27 mmol/L (ref 22–32)
CREATININE: 0.86 mg/dL (ref 0.44–1.00)
Calcium: 8.7 mg/dL — ABNORMAL LOW (ref 8.9–10.3)
Chloride: 105 mmol/L (ref 101–111)
GFR calc non Af Amer: >60 mL/min (ref >60)
GLUCOSE: 101 mg/dL — AB (ref 65–99)
Potassium: 3.7 mmol/L (ref 3.5–5.1)
SODIUM: 140 mmol/L (ref 135–145)
Total Bilirubin: 0.4 mg/dL (ref 0.3–1.2)
Total Protein: 7.9 g/dL (ref 6.5–8.1)

## 2016-12-08 LAB — CBC
HCT: 40.8 % (ref 36.0–46.0)
Hemoglobin: 13.8 g/dL (ref 12.0–15.0)
MCH: 30.9 pg (ref 26.0–34.0)
MCHC: 33.8 g/dL (ref 30.0–36.0)
MCV: 91.5 fL (ref 78.0–100.0)
PLATELETS: 181 10*3/uL (ref 150–400)
RBC: 4.46 MIL/uL (ref 3.87–5.11)
RDW: 13.3 % (ref 11.5–15.5)
WBC: 3.3 10*3/uL — ABNORMAL LOW (ref 4.0–10.5)

## 2016-12-08 LAB — URINALYSIS, ROUTINE W REFLEX MICROSCOPIC
Bilirubin Urine: NEGATIVE
GLUCOSE, UA: NEGATIVE mg/dL
Ketones, ur: 5 mg/dL — AB
Leukocytes, UA: NEGATIVE
Nitrite: NEGATIVE
PH: 6 (ref 5.0–8.0)
PROTEIN: NEGATIVE mg/dL
Specific Gravity, Urine: 1.004 — ABNORMAL LOW (ref 1.005–1.030)

## 2016-12-08 LAB — I-STAT BETA HCG BLOOD, ED (MC, WL, AP ONLY): I-stat hCG, quantitative: <5 m[IU]/mL (ref <5)

## 2016-12-08 LAB — LIPASE, BLOOD: Lipase: 25 U/L (ref 11–51)

## 2016-12-08 MED ORDER — OSELTAMIVIR PHOSPHATE 75 MG PO CAPS: 75.0000 mg | ORAL_CAPSULE | Freq: Two times a day (BID) | ORAL | 0 refills | Status: DC

## 2016-12-08 MED ORDER — ACETAMINOPHEN 500 MG PO TABS
1000.0000 mg | ORAL_TABLET | Freq: Once | ORAL | Status: AC
  Administered 2016-12-08: 1000 mg via ORAL
  Filled 2016-12-08: qty 2

## 2016-12-08 MED ORDER — KETOROLAC TROMETHAMINE 30 MG/ML IJ SOLN
30.0000 mg | Freq: Once | INTRAMUSCULAR | Status: AC
  Administered 2016-12-08: 30 mg via INTRAVENOUS
  Filled 2016-12-08: qty 1

## 2016-12-08 MED ORDER — SODIUM CHLORIDE 0.9 % IV BOLUS (SEPSIS)
1000.0000 mL | Freq: Once | INTRAVENOUS | Status: AC
  Administered 2016-12-08: 1000 mL via INTRAVENOUS

## 2016-12-08 MED ORDER — DOXYCYCLINE HYCLATE 100 MG PO CAPS: 100.0000 mg | ORAL_CAPSULE | Freq: Two times a day (BID) | ORAL | 0 refills | Status: DC

## 2016-12-08 NOTE — ED Triage Notes (Signed)
Pt reports flu like symptoms x 3 days, body aches, nausea, emesis,  coughing , diarrhea. Alert and oriented x 4.

## 2016-12-08 NOTE — Discharge Instructions (Addendum)
Make sure that you're getting plenty of rest, and drinking a lot of fluids.  Use Tylenol or Motrin as needed for pain and fever.  Start taking the prescription medicines to treat both influenza and pneumonia.

## 2016-12-08 NOTE — ED Provider Notes (Signed)
[facility] DEPT Provider Note   CSN: [id number] Arrival date & time: 12/08/16  1322     History   Chief Complaint Chief Complaint  Patient presents with  . flu lke symptoms    HPI Catherine Mckenzie is a 36 y.o. female.  The history is provided by the patient.  Cough  This is a new problem. Episode onset: 4 days ago. The problem occurs constantly. The problem has not changed since onset.The cough is non-productive. There has been no fever. Associated symptoms include chills, rhinorrhea and myalgias. Associated symptoms comments: Congestion, occasional vomiting, occasional diarrhea. She has tried nothing for the symptoms. Risk factors: exposure to child with confirmed influenza A.    Past Medical History:  Diagnosis Date  . Allergy   . Fibroadenoma    left breast  . FIBROADENOMA, BREAST 04/27/2007   Qualifier: Diagnosis of  By: Fuller Plan CMA (AAMA), [place]    . Migraine headache without aura   . Thyroid disease     Patient Active Problem List   Diagnosis Date Noted  . Cholelithiasis 05/22/2015  . Celiac sprue 04/03/2013  . Obesity 07/05/2012  . Migraine headache without aura   . Hypothyroidism 04/27/2007  . FIBROADENOMA, BREAST 04/27/2007    Past Surgical History:  Procedure Laterality Date  . CESAREAN SECTION    . CHOLECYSTECTOMY N/A 05/23/2015   Procedure: LAPAROSCOPIC CHOLECYSTECTOMY;  Surgeon: Fanny Skates, MD;  Location: [facility];  Service: General;  Laterality: N/A;  . ERCP N/A 05/24/2015   Procedure: ENDOSCOPIC RETROGRADE CHOLANGIOPANCREATOGRAPHY (ERCP);  Surgeon: Carol Ada, MD;  Location: [facility];  Service: Endoscopy;  Laterality: N/A;  . WISDOM TOOTH EXTRACTION     x4    OB History    Gravida Para Term Preterm AB Living   3 2 2   1 1    SAB TAB Ectopic Multiple Live Births   1     0 1       Home Medications    Prior to Admission medications   Medication Sig Start Date End Date Taking? Authorizing Provider  acetaminophen (TYLENOL) 500 MG  tablet Take 1,000 mg by mouth every 6 (six) hours as needed. For migraine   Yes Historical Provider, MD  ARMOUR THYROID 60 MG tablet TAKE 2 TABLETS (120 MG TOTAL) BY MOUTH DAILY BEFORE BREAKFAST. Patient taking differently: Take 1 tablet (60 mg) by mouth daily before breakfast. 04/04/15  Yes Donnamae Jude, MD  cetirizine (ZYRTEC) 10 MG tablet Take 10 mg by mouth daily.   Yes Historical Provider, MD  EPIPEN 2-PAK 0.3 MG/0.3ML SOAJ Inject 0.03 mg into the muscle as needed (allergies.).  03/01/13  Yes Historical Provider, MD  ergocalciferol (DRISDOL) 8000 UNIT/ML drops Take 3,000 Units by mouth daily.    Yes Historical Provider, MD  ibuprofen (ADVIL,MOTRIN) 200 MG tablet Take by mouth every 6 (six) hours as needed for fever or mild pain. As directed, per package instructions.   Yes Historical Provider, MD  norethindrone-ethinyl estradiol (OVCON-35,BALZIVA,BRIELLYN) 0.4-35 MG-MCG tablet Take 1 tablet by mouth daily. 03/13/16  Yes Donnamae Jude, MD  Phenylephrine-DM-GG-APAP (MUCINEX FAST-MAX COLD FLU) 5-10-200-325 MG TABS Take by mouth as directed. Per package instructions.   Yes Historical Provider, MD  Tuberculin-Allergy Syringes (ALLERGY SYRINGE 1CC/27GX1/2") 27G X 1/2" 1 ML MISC 1 each by Does not apply route once a week. Pt stated that she gets allergy injections weekly   Yes Historical Provider, MD    Family History Family History  Problem Relation Age of Onset  .  Diabetes Mother   . Hypertension Mother   . Hyperlipidemia Mother   . Stroke Mother     TIA  . Deep vein thrombosis Mother   . Asthma Sister   . Depression Sister   . Cancer Maternal Grandmother 50    PANCREATIC  . Heart disease Maternal Grandfather   . Hypertension Maternal Grandfather   . Hypertension Paternal Grandmother   . Cancer Paternal Grandmother     BREAST  . Stroke Paternal Grandmother   . Cancer Paternal Grandfather     PROSTATE  . Seizures Paternal Grandfather   . Hypertension Paternal Grandfather   . Stroke  Paternal Grandfather     Social History Social History  Substance Use Topics  . Smoking status: Never Smoker  . Smokeless tobacco: Never Used  . Alcohol use Yes     Comment: rarely     Allergies   Levaquin [levofloxacin]; Moxifloxacin; Other; Soy allergy; Sulfur; Wheat bran; Benzocaine; and Benzoyl peroxide-erythromycin   Review of Systems Review of Systems  Constitutional: Positive for chills.  HENT: Positive for rhinorrhea.   Respiratory: Positive for cough.   Musculoskeletal: Positive for myalgias.  All other systems reviewed and are negative.    Physical Exam Updated Vital Signs BP 132/90 (BP Location: Left Arm)   Pulse 100   Temp 98.9 F (37.2 C) (Oral)   Resp 18   LMP 11/24/2016   SpO2 100%   Physical Exam  Constitutional: She is oriented to person, place, and time. She appears well-developed and well-nourished. No distress.  HENT:  Head: Normocephalic.  Nose: Nose normal.  Eyes: Conjunctivae are normal.  Neck: Neck supple. No tracheal deviation present.  Cardiovascular: Normal rate, regular rhythm and normal heart sounds.   No murmur heard. Pulmonary/Chest: Effort normal and breath sounds normal. No respiratory distress. She has no wheezes. She has no rales.  Abdominal: Soft. She exhibits no distension. There is no tenderness. There is no guarding.  Neurological: She is alert and oriented to person, place, and time.  Skin: Skin is warm and dry. Capillary refill takes less than 2 seconds.  Psychiatric: She has a normal mood and affect.  Vitals reviewed.    ED Treatments / Results  Labs (all labs ordered are listed, but only abnormal results are displayed) Labs Reviewed  COMPREHENSIVE METABOLIC PANEL - Abnormal; Notable for the following:       Result Value   Glucose, Bld 101 (*)    Calcium 8.7 (*)    All other components within normal limits  CBC - Abnormal; Notable for the following:    WBC 3.3 (*)    All other components within normal limits    URINALYSIS, ROUTINE W REFLEX MICROSCOPIC - Abnormal; Notable for the following:    Specific Gravity, Urine 1.004 (*)    Hgb urine dipstick SMALL (*)    Ketones, ur 5 (*)    Bacteria, UA RARE (*)    Squamous Epithelial / LPF 0-5 (*)    All other components within normal limits  LIPASE, BLOOD  I-STAT BETA HCG BLOOD, ED ([facility], [facility], [facility] ONLY)    EKG  EKG Interpretation None       Radiology Dg Chest 2 View  Result Date: 12/08/2016 CLINICAL DATA:  Flu like symptoms. EXAM: CHEST  2 VIEW COMPARISON:  None. FINDINGS: Right lung is clear. Patchy airspace disease is noted at the left base, compatible with atelectasis or pneumonia. The cardiopericardial silhouette is within normal limits for size. The visualized bony structures  of the thorax are intact. IMPRESSION: Patchy airspace disease left lung base, compatible with atelectasis or pneumonia. Electronically Signed   By: Misty Stanley M.D.   On: 12/08/2016 16:52    Procedures Procedures (including critical care time)  Medications Ordered in ED Medications  sodium chloride 0.9 % bolus 1,000 mL (not administered)  ketorolac (TORADOL) 30 MG/ML injection 30 mg (not administered)  acetaminophen (TYLENOL) tablet 1,000 mg (not administered)     Initial Impression / Assessment and Plan / ED Course  I have reviewed the triage vital signs and the nursing notes.  Pertinent labs & imaging results that were available during my care of the patient were reviewed by me and considered in my medical decision making (see chart for details).     36 year old female presents with intermittent nausea, vomiting, diarrhea, body aches, and cough over the last 4-5 days. Her child contracted similar symptoms and went to the pediatrician, they swabbed positive for influenza A and were started on Tamiflu. The patient is concerned she may have developed a secondary pneumonia because her cough continues to worsen and her symptoms have persisted. Labs reassuring with  some mild leukopenia which may be attributed to viral illness but given patient concerns will perform chest x-ray to rule out a secondary bacterial pneumonia.  Chest x-ray with questionable infiltrate. I have low suspicion for pneumonia currently but discussed empiric treatment versus expectant management and patient preferred to undergo treatment for community acquired pneumonia and antiviral medication. Prescriptions provided, plan for discharge following supportive care measures were administration.  Final Clinical Impressions(s) / ED Diagnoses   Final diagnoses:  Influenza  Community acquired pneumonia of left lower lobe of lung (HCC)    New Prescriptions New Prescriptions   DOXYCYCLINE (VIBRAMYCIN) 100 MG CAPSULE    Take 1 capsule (100 mg total) by mouth 2 (two) times daily.   OSELTAMIVIR (TAMIFLU) 75 MG CAPSULE    Take 1 capsule (75 mg total) by mouth every 12 (twelve) hours.     Leo Grosser, MD 12/08/16 [phone]

## 2016-12-21 DIAGNOSIS — M542 Cervicalgia: Secondary | ICD-10-CM | POA: Diagnosis not present

## 2016-12-21 DIAGNOSIS — M5414 Radiculopathy, thoracic region: Secondary | ICD-10-CM | POA: Diagnosis not present

## 2017-01-16 ENCOUNTER — Other Ambulatory Visit: Payer: Self-pay | Admitting: Family Medicine

## 2017-01-16 DIAGNOSIS — Z30011 Encounter for initial prescription of contraceptive pills: Secondary | ICD-10-CM

## 2017-03-14 IMAGING — CT CT RENAL STONE PROTOCOL
2 of 4 series · 16 of 46 positions shown, 18 images · non-contrast
Comparison: None.

CLINICAL DATA: Right upper quadrant and right flank pain.

EXAM:
CT ABDOMEN AND PELVIS WITHOUT CONTRAST
TECHNIQUE: Multidetector CT imaging of the abdomen and pelvis was performed
following the standard protocol without IV contrast.

[Series 2: renal stone > 200 lbs 5.0 b31f · axial · 0.75mm/px · z∈[+588,+1053]mm · 13 of 103 slices shown, 15 images]
[im 5/103  soft-tissue]
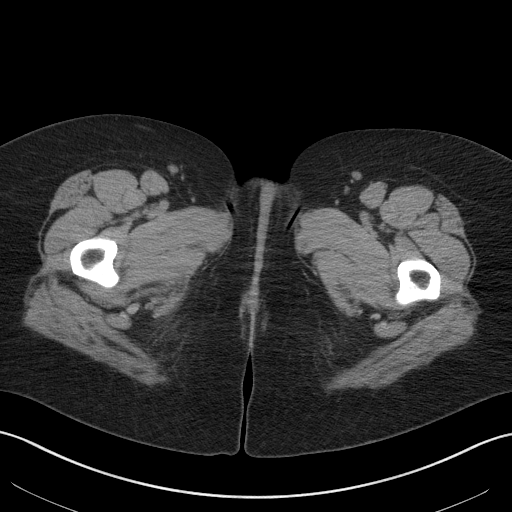
[im 5/103  bone]
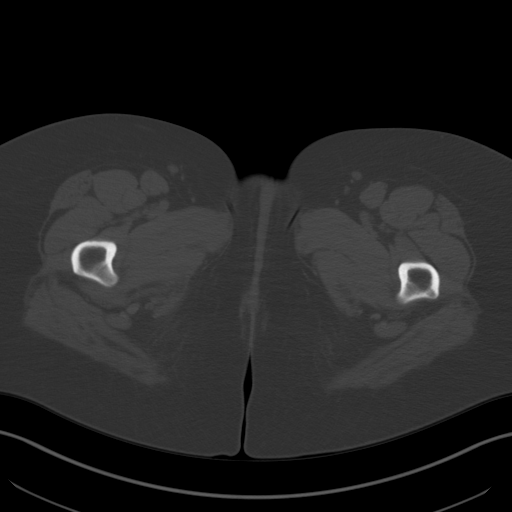
[im 13/103  soft-tissue]
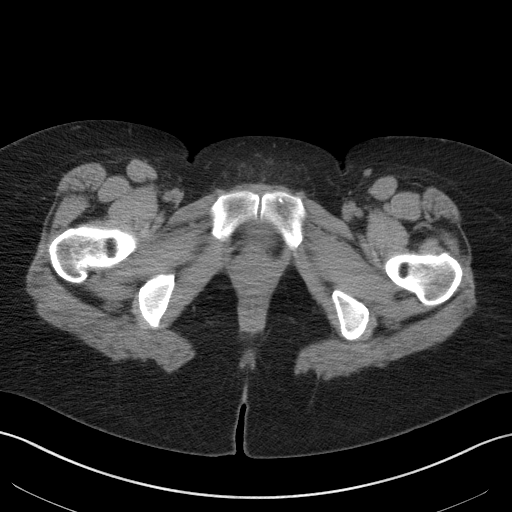
[im 21/103  soft-tissue]
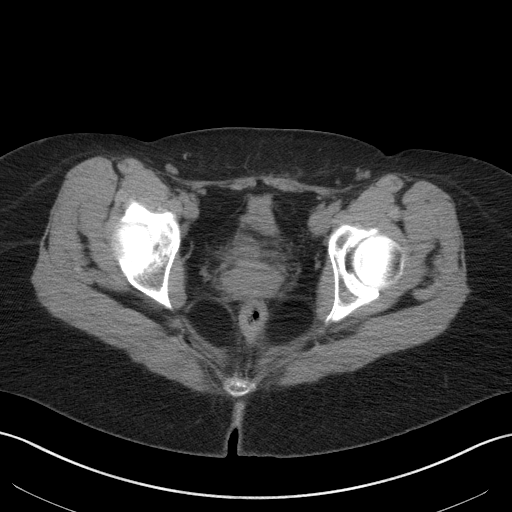
[im 29/103  soft-tissue]
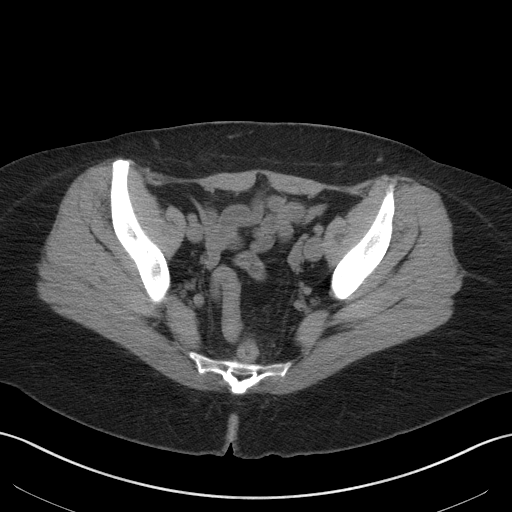
[im 37/103  soft-tissue]
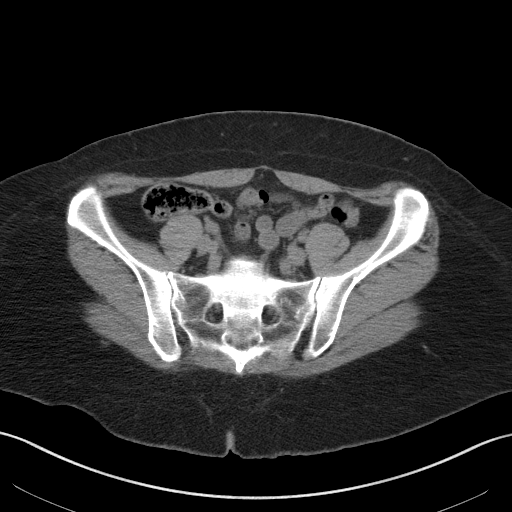
[im 45/103  soft-tissue]
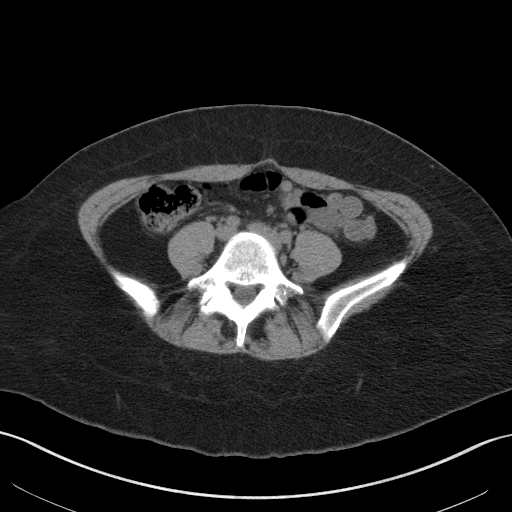
[im 54/103  soft-tissue]
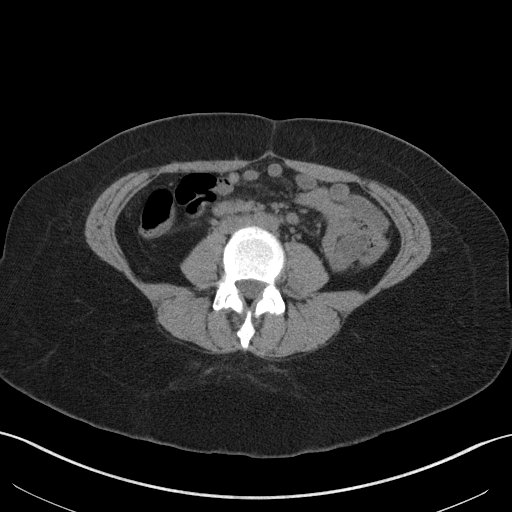
[im 58/103  soft-tissue]
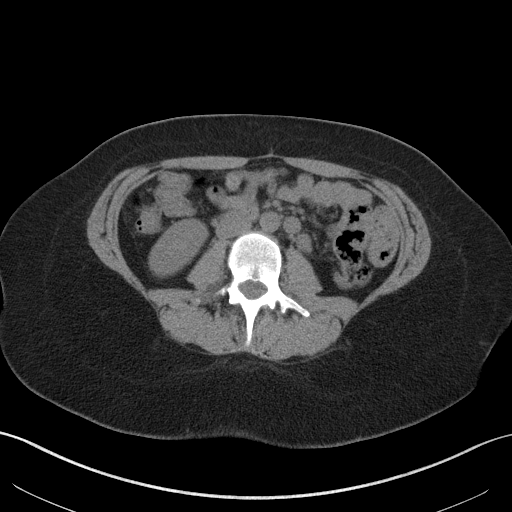
[im 66/103  soft-tissue]
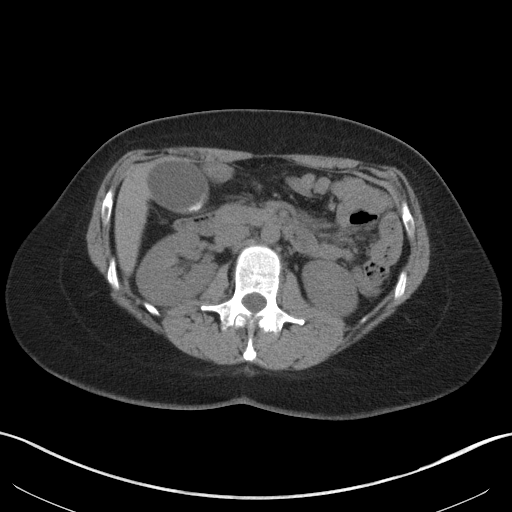
[im 66/103  bone]
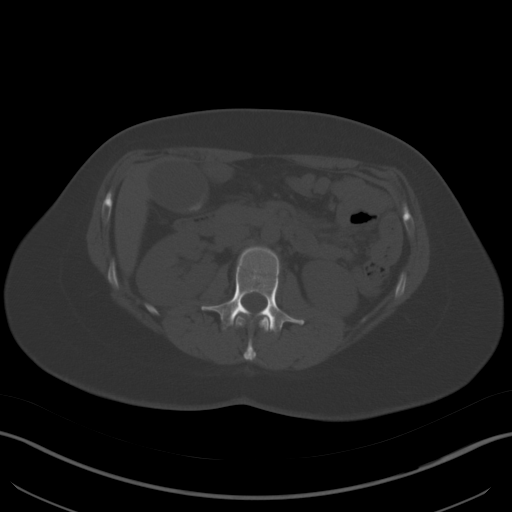
[im 74/103  soft-tissue]
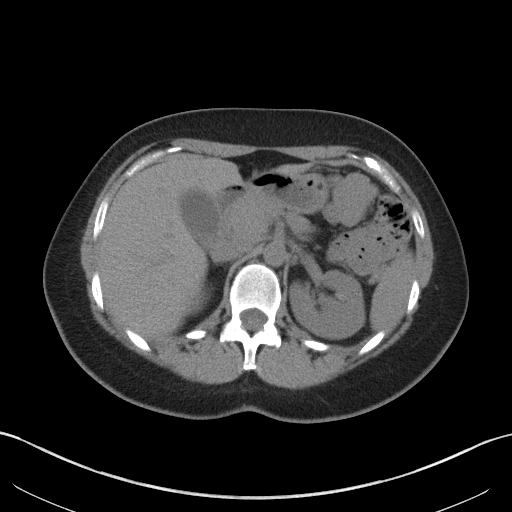
[im 82/103  soft-tissue]
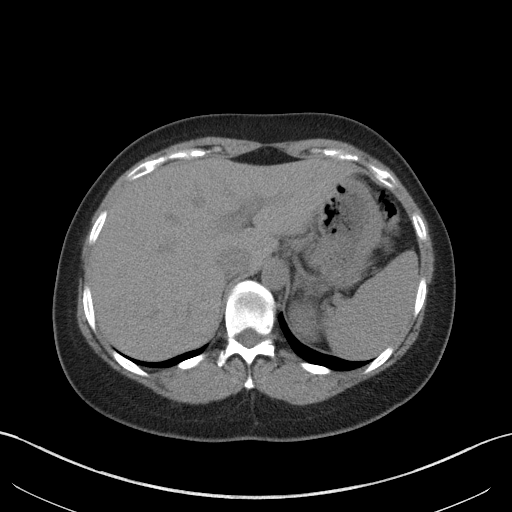
[im 90/103  soft-tissue]
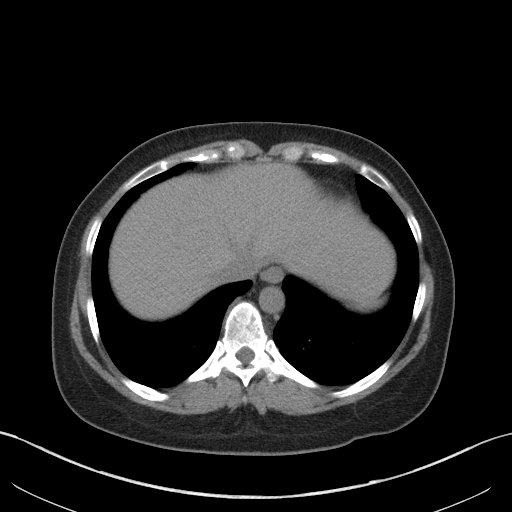
[im 98/103  soft-tissue]
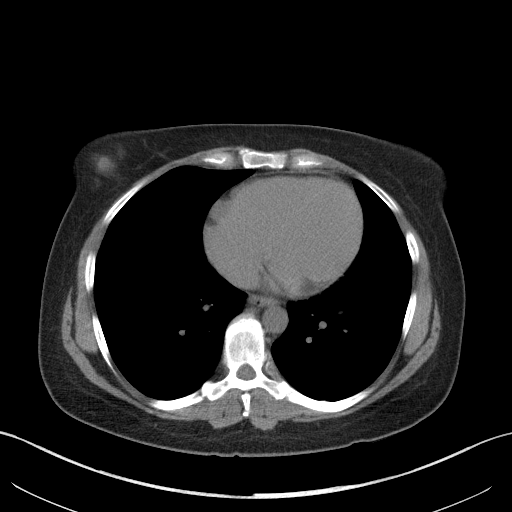

[Series 5: renal stone 3.0 coronal · coronal · 0.72mm/px · 3 of 85 slices shown]
[im 29/85  soft-tissue]
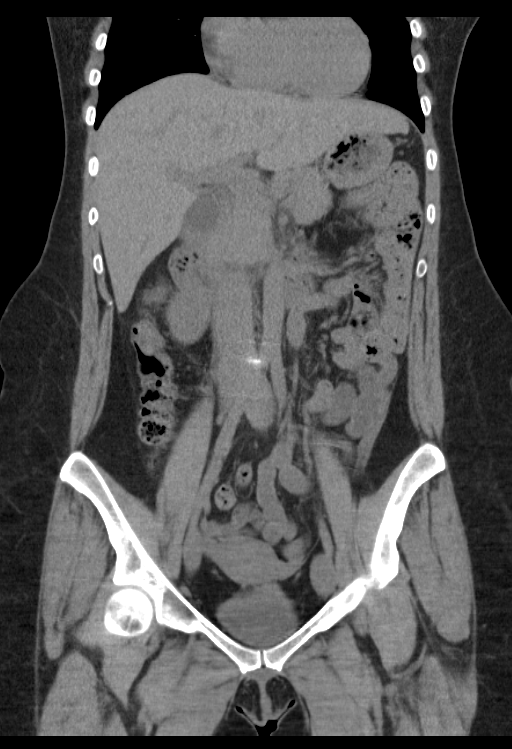
[im 38/85  soft-tissue]
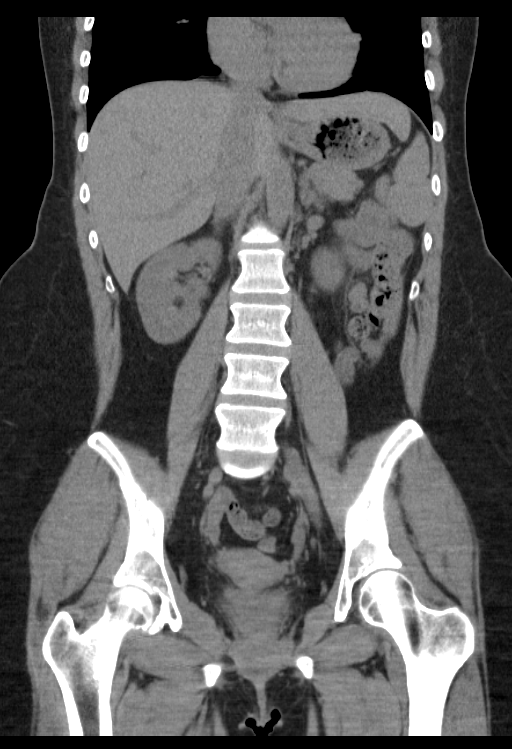
[im 47/85  soft-tissue]
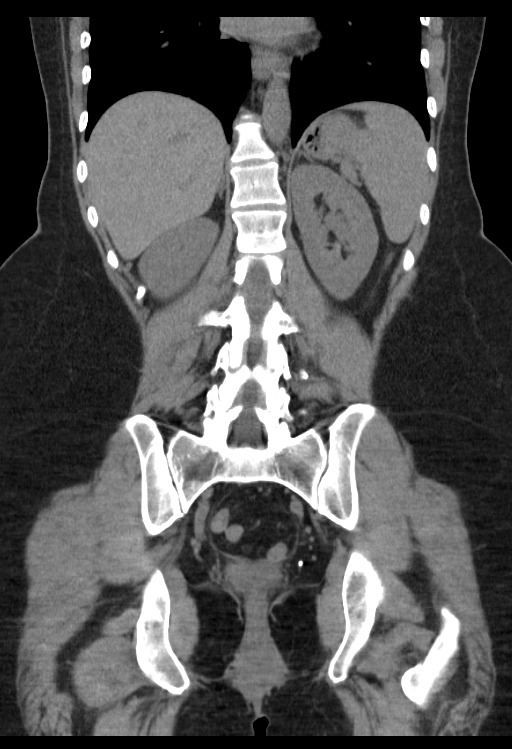

[16 of 46 positions shown; findings below may reference images not displayed]

FINDINGS: There is calcification of a portion of the gallbladder wall. No
gallbladder calculi are evident. There is no bile duct dilatation.
There are unremarkable unenhanced appearances of the liver, spleen,
pancreas, adrenals and kidneys. There is no urinary calculus. There
is no hydronephrosis or ureteral dilatation.

The abdominal aorta is normal in caliber. There is no
atherosclerotic calcification. There is no adenopathy in the abdomen
or pelvis.

There are unremarkable appearances of the stomach, small bowel and
colon. The uterus and ovaries appear unremarkable.

There are no acute inflammatory changes in the abdomen or pelvis.
Lung bases are clear. There is no significant musculoskeletal
abnormality.
IMPRESSION: Calcification of a portion of the gallbladder wall (porcelain
gallbladder).

No acute findings are evident in the abdomen or pelvis.

## 2017-04-01 DIAGNOSIS — M5414 Radiculopathy, thoracic region: Secondary | ICD-10-CM | POA: Diagnosis not present

## 2017-04-01 DIAGNOSIS — M542 Cervicalgia: Secondary | ICD-10-CM | POA: Diagnosis not present

## 2017-04-04 DIAGNOSIS — J340 Abscess, furuncle and carbuncle of nose: Secondary | ICD-10-CM | POA: Diagnosis not present

## 2017-04-08 DIAGNOSIS — J3089 Other allergic rhinitis: Secondary | ICD-10-CM | POA: Diagnosis not present

## 2017-04-08 DIAGNOSIS — J3081 Allergic rhinitis due to animal (cat) (dog) hair and dander: Secondary | ICD-10-CM | POA: Diagnosis not present

## 2017-04-09 ENCOUNTER — Other Ambulatory Visit: Payer: Self-pay | Admitting: Family Medicine

## 2017-04-09 DIAGNOSIS — Z30011 Encounter for initial prescription of contraceptive pills: Secondary | ICD-10-CM

## 2017-04-29 ENCOUNTER — Encounter: Payer: Self-pay | Admitting: Obstetrics & Gynecology

## 2017-04-29 ENCOUNTER — Ambulatory Visit (INDEPENDENT_AMBULATORY_CARE_PROVIDER_SITE_OTHER): Payer: BLUE CROSS/BLUE SHIELD | Admitting: Obstetrics & Gynecology

## 2017-04-29 VITALS — BP 121/79 | HR 91 | Resp 18 | Ht 67.0 in | Wt 192.0 lb

## 2017-04-29 DIAGNOSIS — Z01419 Encounter for gynecological examination (general) (routine) without abnormal findings: Secondary | ICD-10-CM | POA: Diagnosis not present

## 2017-04-29 DIAGNOSIS — Z832 Family history of diseases of the blood and blood-forming organs and certain disorders involving the immune mechanism: Secondary | ICD-10-CM

## 2017-04-29 DIAGNOSIS — Z1151 Encounter for screening for human papillomavirus (HPV): Secondary | ICD-10-CM

## 2017-04-29 DIAGNOSIS — Z124 Encounter for screening for malignant neoplasm of cervix: Secondary | ICD-10-CM | POA: Diagnosis not present

## 2017-04-29 NOTE — Progress Notes (Signed)
Subjective:    Catherine Mckenzie is a 36 y.o. MW P2 (68 and 27 yo kids) female who presents for an annual exam. The patient has no complaints today. She would like to be tested for Factor 5 Leiden def as her mom was recently diagnosed with this. The patient is sexually active. GYN screening history: last pap: was normal. The patient wears seatbelts: yes. The patient participates in regular exercise: yes. Has the patient ever been transfused or tattooed?: no. The patient reports that there is not domestic violence in her life.   Menstrual History: OB History    Gravida Para Term Preterm AB Living   3 2 2   1 1    SAB TAB Ectopic Multiple Live Births   1     0 1      Menarche age: 39 Patient's last menstrual period was 04/12/2017.    The following portions of the patient's history were reviewed and updated as appropriate: allergies, current medications, past family history, past medical history, past social history, past surgical history and problem list.  Review of Systems Pertinent items are noted in HPI.   Married 10 years On OCPs for 10+ years, doesn't smoke Doesn't want more kids, thinking about vasectomy Works at [employer], works from home + breast cancer -pgm, maunt + pancrease-mgm No gyn or colon cancer She had a colonoscopy due to post GB surgery diarrhea  Objective:    BP 121/79   Pulse 91   Resp 18   Ht 5\' 7"  (1.702 m)   Wt 192 lb (87.1 kg)   LMP 04/12/2017   BMI 30.07 kg/m   General Appearance:    Alert, cooperative, no distress, appears stated age  Head:    Normocephalic, without obvious abnormality, atraumatic  Eyes:    PERRL, conjunctiva/corneas clear, EOM's intact, fundi    benign, both eyes  Ears:    Normal TM's and external ear canals, both ears  Nose:   Nares normal, septum midline, mucosa normal, no drainage    or sinus tenderness  Throat:   Lips, mucosa, and tongue normal; teeth and gums normal  Neck:   Supple, symmetrical, trachea midline, no adenopathy;     thyroid:  no enlargement/tenderness/nodules; no carotid   bruit or JVD  Back:     Symmetric, no curvature, ROM normal, no CVA tenderness  Lungs:     Clear to auscultation bilaterally, respirations unlabored  Chest Wall:    No tenderness or deformity   Heart:    Regular rate and rhythm, S1 and S2 normal, no murmur, rub   or gallop  Breast Exam:    No tenderness, masses, or nipple abnormality  Abdomen:     Soft, non-tender, bowel sounds active all four quadrants,    no masses, no organomegaly  Genitalia:    Normal female without lesion, discharge or tenderness, NSSA, NT, mobile, no adnexal masses     Extremities:   Extremities normal, atraumatic, no cyanosis or edema  Pulses:   2+ and symmetric all extremities  Skin:   Skin color, texture, turgor normal, no rashes or lesions  Lymph nodes:   Cervical, supraclavicular, and axillary nodes normal  Neurologic:   CNII-XII intact, normal strength, sensation and reflexes    throughout  .    Assessment:    Healthy female exam.    Plan:     Thin prep Pap smear.   Check Factor 5 Rec stop OCPs and use condoms until results available

## 2017-05-03 LAB — CYTOLOGY - PAP
Diagnosis: NEGATIVE
HPV (WINDOPATH): NOT DETECTED

## 2017-05-04 LAB — FACTOR 5 LEIDEN

## 2017-05-05 ENCOUNTER — Other Ambulatory Visit: Payer: Self-pay | Admitting: Family Medicine

## 2017-05-05 DIAGNOSIS — Z30011 Encounter for initial prescription of contraceptive pills: Secondary | ICD-10-CM

## 2017-06-04 ENCOUNTER — Other Ambulatory Visit: Payer: Self-pay | Admitting: Family Medicine

## 2017-06-04 DIAGNOSIS — Z30011 Encounter for initial prescription of contraceptive pills: Secondary | ICD-10-CM

## 2017-06-15 DIAGNOSIS — J329 Chronic sinusitis, unspecified: Secondary | ICD-10-CM | POA: Diagnosis not present

## 2017-06-15 DIAGNOSIS — J301 Allergic rhinitis due to pollen: Secondary | ICD-10-CM | POA: Diagnosis not present

## 2017-06-15 DIAGNOSIS — R040 Epistaxis: Secondary | ICD-10-CM | POA: Diagnosis not present

## 2017-06-18 DIAGNOSIS — B078 Other viral warts: Secondary | ICD-10-CM | POA: Diagnosis not present

## 2017-06-18 DIAGNOSIS — D225 Melanocytic nevi of trunk: Secondary | ICD-10-CM | POA: Diagnosis not present

## 2017-06-18 DIAGNOSIS — Z1283 Encounter for screening for malignant neoplasm of skin: Secondary | ICD-10-CM | POA: Diagnosis not present

## 2017-07-12 ENCOUNTER — Other Ambulatory Visit: Payer: Self-pay | Admitting: Family Medicine

## 2017-07-12 DIAGNOSIS — Z30011 Encounter for initial prescription of contraceptive pills: Secondary | ICD-10-CM

## 2017-10-15 DIAGNOSIS — J301 Allergic rhinitis due to pollen: Secondary | ICD-10-CM | POA: Diagnosis not present

## 2017-10-15 DIAGNOSIS — J3089 Other allergic rhinitis: Secondary | ICD-10-CM | POA: Diagnosis not present

## 2017-11-08 DIAGNOSIS — N390 Urinary tract infection, site not specified: Secondary | ICD-10-CM | POA: Diagnosis not present

## 2017-11-08 DIAGNOSIS — R300 Dysuria: Secondary | ICD-10-CM | POA: Diagnosis not present

## 2017-11-24 ENCOUNTER — Encounter (INDEPENDENT_AMBULATORY_CARE_PROVIDER_SITE_OTHER): Payer: Self-pay | Admitting: Orthopaedic Surgery

## 2017-11-24 ENCOUNTER — Ambulatory Visit (INDEPENDENT_AMBULATORY_CARE_PROVIDER_SITE_OTHER): Payer: BLUE CROSS/BLUE SHIELD

## 2017-11-24 ENCOUNTER — Ambulatory Visit (INDEPENDENT_AMBULATORY_CARE_PROVIDER_SITE_OTHER): Payer: BLUE CROSS/BLUE SHIELD | Admitting: Orthopaedic Surgery

## 2017-11-24 DIAGNOSIS — M7062 Trochanteric bursitis, left hip: Secondary | ICD-10-CM | POA: Diagnosis not present

## 2017-11-24 DIAGNOSIS — M25552 Pain in left hip: Secondary | ICD-10-CM | POA: Diagnosis not present

## 2017-11-24 MED ORDER — LIDOCAINE HCL 1 % IJ SOLN
3.0000 mL | INTRAMUSCULAR | Status: AC | PRN
  Administered 2017-11-24: 3 mL

## 2017-11-24 MED ORDER — METHYLPREDNISOLONE ACETATE 40 MG/ML IJ SUSP
40.0000 mg | INTRAMUSCULAR | Status: AC | PRN
  Administered 2017-11-24: 40 mg via INTRA_ARTICULAR

## 2017-11-24 NOTE — Progress Notes (Signed)
Office Visit Note   Patient: Catherine Mckenzie           Date of Birth: Feb 28, 1981           MRN: [id number] Visit Date: 11/24/2017              Requested by: Donnamae Jude, MD [place], [place] [id number] PCP: Donnamae Jude, MD   Assessment & Plan: Visit Diagnoses:  1. Pain in left hip   2. Trochanteric bursitis, left hip     Plan: Described the risk and benefits of these injections.  She does wish to have this done and she tolerated it well.  I showed her stretching exercises to try as well.  She will try not to lay on that left side at night either.  I told her if this is still bothering her after another 4 weeks to give [place] a call because we set her up for outpatient formal physical therapy she will also try Aspercreme with lidocaine and 60 mg of ibuprofen twice a day for at least the next 6 days all questions concerns were answered and addressed.  Follow-Up Instructions: Return if symptoms worsen or fail to improve.   Orders:  Orders Placed This Encounter  Procedures  . XR HIP UNILAT W OR W/O PELVIS 1V LEFT   No orders of the defined types were placed in this encounter.     Procedures: Large Joint Inj: L greater trochanter on 11/24/2017 11:36 AM Indications: pain and diagnostic evaluation Details: 22 G 1.5 in needle, lateral approach  Arthrogram: No  Medications: 3 mL lidocaine 1 %; 40 mg methylPREDNISolone acetate 40 MG/ML Outcome: tolerated well, no immediate complications Procedure, treatment alternatives, risks and benefits explained, specific risks discussed. Consent was given by the patient. Immediately prior to procedure a time out was called to verify the correct patient, procedure, equipment, support staff and site/side marked as required. Patient was prepped and draped in the usual sterile fashion.       Clinical Data: No additional findings.   Subjective: No chief complaint on file. The patient comes in today with chief complaint of  1-2 months of left hip pain.  It hurts when she lays on that side at night.  She denies any groin pain.  She denies any trauma to her hip.  She denies any back symptoms or radicular symptoms either.  Just hurts on the lateral side of her hip.  She became a side sleeper when she was pregnant on the left side.  It is not detrimentally affected her activity living her quality of life.  She has been told she does feel like this is actually normal for several years now.  This is worsened over the last month.  I do feel that this is a trochanteric bursitis she is dealing with with her left hip.  I talked about a steroid injection and  HPI  Review of Systems She denies any headache, chest pain, shortness of breath, fever, chills, nausea, vomiting.  Objective: Vital Signs: There were no vitals taken for this visit.  Physical Exam She is alert and oriented x3 and in no acute distress Ortho Exam Examination of her left hip shows fluid and full range of motion actively and passively.  Her knee foot and ankle exam and back exam are normal.  She has significant pain to palpation over the trochanteric area. Specialty Comments:  No specialty comments available.  Imaging: Xr Hip Unilat W Or W/o  Pelvis 1v Left  Result Date: 11/24/2017 An AP pelvis and lateral of the left hip shows no acute findings.  The hip is well located.  There is no cortical irregularities.    PMFS History: Patient Active Problem List   Diagnosis Date Noted  . Trochanteric bursitis, left hip 11/24/2017  . Cholelithiasis 05/22/2015  . Celiac sprue 04/03/2013  . Obesity 07/05/2012  . Migraine headache without aura   . Hypothyroidism 04/27/2007  . FIBROADENOMA, BREAST 04/27/2007   Past Medical History:  Diagnosis Date  . Allergy   . Fibroadenoma    left breast  . FIBROADENOMA, BREAST 04/27/2007   Qualifier: Diagnosis of  By: Fuller Plan CMA (AAMA), [place]    . Migraine headache without aura   . Thyroid disease     Family  History  Problem Relation Age of Onset  . Diabetes Mother   . Hypertension Mother   . Hyperlipidemia Mother   . Stroke Mother        TIA  . Deep vein thrombosis Mother   . Heart disease Mother   . Asthma Sister   . Depression Sister   . Cancer Maternal Grandmother 50       PANCREATIC  . Heart disease Maternal Grandfather   . Hypertension Maternal Grandfather   . Hypertension Paternal Grandmother   . Cancer Paternal Grandmother        BREAST  . Stroke Paternal Grandmother   . Cancer Paternal Grandfather        PROSTATE  . Seizures Paternal Grandfather   . Hypertension Paternal Grandfather   . Stroke Paternal Grandfather     Past Surgical History:  Procedure Laterality Date  . CESAREAN SECTION    . CHOLECYSTECTOMY N/A 05/23/2015   Procedure: LAPAROSCOPIC CHOLECYSTECTOMY;  Surgeon: Fanny Skates, MD;  Location: [facility];  Service: General;  Laterality: N/A;  . ERCP N/A 05/24/2015   Procedure: ENDOSCOPIC RETROGRADE CHOLANGIOPANCREATOGRAPHY (ERCP);  Surgeon: Carol Ada, MD;  Location: [facility];  Service: Endoscopy;  Laterality: N/A;  . WISDOM TOOTH EXTRACTION     x4   Social History   Occupational History  . Not on file  Tobacco Use  . Smoking status: Never Smoker  . Smokeless tobacco: Never Used  Substance and Sexual Activity  . Alcohol use: Yes    Comment: rarely  . Drug use: No  . Sexual activity: Yes    Partners: Male    Birth control/protection: Pill

## 2017-11-24 NOTE — Progress Notes (Signed)
troch

## 2017-12-13 ENCOUNTER — Telehealth: Payer: Self-pay | Admitting: *Deleted

## 2017-12-13 NOTE — Telephone Encounter (Signed)
Fax received from pharmacy requesting a 90 day supply of valcyclovir for patient.  Will forward to MD to advise as this in not on her current medication list. Catherine Mckenzie

## 2017-12-14 MED ORDER — VALACYCLOVIR HCL 1 G PO TABS: 500.0000 mg | ORAL_TABLET | Freq: Every day | ORAL | 2 refills | Status: AC

## 2017-12-14 NOTE — Telephone Encounter (Signed)
Pt aware medication at pharmacy

## 2017-12-15 DIAGNOSIS — J301 Allergic rhinitis due to pollen: Secondary | ICD-10-CM | POA: Diagnosis not present

## 2017-12-16 DIAGNOSIS — J3089 Other allergic rhinitis: Secondary | ICD-10-CM | POA: Diagnosis not present

## 2017-12-16 DIAGNOSIS — J3081 Allergic rhinitis due to animal (cat) (dog) hair and dander: Secondary | ICD-10-CM | POA: Diagnosis not present

## 2018-02-16 ENCOUNTER — Encounter: Payer: Self-pay | Admitting: Radiology

## 2018-03-14 DIAGNOSIS — M9901 Segmental and somatic dysfunction of cervical region: Secondary | ICD-10-CM | POA: Diagnosis not present

## 2018-03-14 DIAGNOSIS — G44209 Tension-type headache, unspecified, not intractable: Secondary | ICD-10-CM | POA: Diagnosis not present

## 2018-03-14 DIAGNOSIS — M9902 Segmental and somatic dysfunction of thoracic region: Secondary | ICD-10-CM | POA: Diagnosis not present

## 2018-03-14 DIAGNOSIS — M542 Cervicalgia: Secondary | ICD-10-CM | POA: Diagnosis not present

## 2018-03-15 DIAGNOSIS — M9901 Segmental and somatic dysfunction of cervical region: Secondary | ICD-10-CM | POA: Diagnosis not present

## 2018-03-15 DIAGNOSIS — M542 Cervicalgia: Secondary | ICD-10-CM | POA: Diagnosis not present

## 2018-03-15 DIAGNOSIS — M9902 Segmental and somatic dysfunction of thoracic region: Secondary | ICD-10-CM | POA: Diagnosis not present

## 2018-03-15 DIAGNOSIS — G44209 Tension-type headache, unspecified, not intractable: Secondary | ICD-10-CM | POA: Diagnosis not present

## 2018-03-22 DIAGNOSIS — M9902 Segmental and somatic dysfunction of thoracic region: Secondary | ICD-10-CM | POA: Diagnosis not present

## 2018-03-22 DIAGNOSIS — M542 Cervicalgia: Secondary | ICD-10-CM | POA: Diagnosis not present

## 2018-03-22 DIAGNOSIS — M9901 Segmental and somatic dysfunction of cervical region: Secondary | ICD-10-CM | POA: Diagnosis not present

## 2018-03-22 DIAGNOSIS — G44209 Tension-type headache, unspecified, not intractable: Secondary | ICD-10-CM | POA: Diagnosis not present

## 2018-04-05 DIAGNOSIS — M9901 Segmental and somatic dysfunction of cervical region: Secondary | ICD-10-CM | POA: Diagnosis not present

## 2018-04-05 DIAGNOSIS — M9902 Segmental and somatic dysfunction of thoracic region: Secondary | ICD-10-CM | POA: Diagnosis not present

## 2018-04-05 DIAGNOSIS — G44209 Tension-type headache, unspecified, not intractable: Secondary | ICD-10-CM | POA: Diagnosis not present

## 2018-04-05 DIAGNOSIS — M542 Cervicalgia: Secondary | ICD-10-CM | POA: Diagnosis not present

## 2018-04-21 DIAGNOSIS — M542 Cervicalgia: Secondary | ICD-10-CM | POA: Diagnosis not present

## 2018-04-21 DIAGNOSIS — G44209 Tension-type headache, unspecified, not intractable: Secondary | ICD-10-CM | POA: Diagnosis not present

## 2018-04-21 DIAGNOSIS — M9901 Segmental and somatic dysfunction of cervical region: Secondary | ICD-10-CM | POA: Diagnosis not present

## 2018-04-21 DIAGNOSIS — M9902 Segmental and somatic dysfunction of thoracic region: Secondary | ICD-10-CM | POA: Diagnosis not present

## 2018-05-10 DIAGNOSIS — G44209 Tension-type headache, unspecified, not intractable: Secondary | ICD-10-CM | POA: Diagnosis not present

## 2018-05-10 DIAGNOSIS — M9901 Segmental and somatic dysfunction of cervical region: Secondary | ICD-10-CM | POA: Diagnosis not present

## 2018-05-10 DIAGNOSIS — M9902 Segmental and somatic dysfunction of thoracic region: Secondary | ICD-10-CM | POA: Diagnosis not present

## 2018-05-10 DIAGNOSIS — M542 Cervicalgia: Secondary | ICD-10-CM | POA: Diagnosis not present

## 2018-05-12 ENCOUNTER — Other Ambulatory Visit: Payer: Self-pay | Admitting: Family Medicine

## 2018-05-12 DIAGNOSIS — Z30011 Encounter for initial prescription of contraceptive pills: Secondary | ICD-10-CM

## 2018-05-26 DIAGNOSIS — M9902 Segmental and somatic dysfunction of thoracic region: Secondary | ICD-10-CM | POA: Diagnosis not present

## 2018-05-26 DIAGNOSIS — G44209 Tension-type headache, unspecified, not intractable: Secondary | ICD-10-CM | POA: Diagnosis not present

## 2018-05-26 DIAGNOSIS — M542 Cervicalgia: Secondary | ICD-10-CM | POA: Diagnosis not present

## 2018-05-26 DIAGNOSIS — M9901 Segmental and somatic dysfunction of cervical region: Secondary | ICD-10-CM | POA: Diagnosis not present

## 2018-06-07 ENCOUNTER — Other Ambulatory Visit: Payer: Self-pay | Admitting: Family Medicine

## 2018-06-07 ENCOUNTER — Encounter: Payer: Self-pay | Admitting: Family Medicine

## 2018-06-07 ENCOUNTER — Ambulatory Visit: Payer: BLUE CROSS/BLUE SHIELD | Admitting: Family Medicine

## 2018-06-07 VITALS — BP 139/75 | HR 78 | Resp 16 | Ht 67.0 in | Wt 200.0 lb

## 2018-06-07 DIAGNOSIS — Z01419 Encounter for gynecological examination (general) (routine) without abnormal findings: Secondary | ICD-10-CM | POA: Diagnosis not present

## 2018-06-07 DIAGNOSIS — N6022 Fibroadenosis of left breast: Secondary | ICD-10-CM

## 2018-06-07 DIAGNOSIS — N393 Stress incontinence (female) (male): Secondary | ICD-10-CM

## 2018-06-07 NOTE — Progress Notes (Signed)
  Subjective:     Catherine Mckenzie is a 37 y.o. female and is here for a comprehensive physical exam. The patient reports no problems. Has h/o fibroadenoma since 2006. Did routine ultrasounds for this, but none since On OC's which are working well. Labs done through work. Reports leakage of urine with valsalva and exercise. Wants referral to PT.  The following portions of the patient's history were reviewed and updated as appropriate: allergies, current medications, past family history, past medical history, past social history, past surgical history and problem list.  Review of Systems Pertinent items noted in HPI and remainder of comprehensive ROS otherwise negative.   Objective:    BP 139/75 (BP Location: Left Arm, Patient Position: Sitting, Cuff Size: Large)   Pulse 78   Resp 16   Ht 5\' 7"  (1.702 m)   Wt 200 lb (90.7 kg)   LMP 06/07/2018 (Exact Date)   BMI 31.32 kg/m  General appearance: alert, cooperative and appears stated age Head: Normocephalic, without obvious abnormality, atraumatic Neck: no adenopathy, supple, symmetrical, trachea midline and thyroid not enlarged, symmetric, no tenderness/mass/nodules Lungs: clear to auscultation bilaterally Breasts: normal appearance, no masses or tenderness, positive findings: fibrocystic changes and sub cm, smooth, firm, mobile and non-tender nodule located on the left upper inner quadrant Heart: regular rate and rhythm, S1, S2 normal, no murmur, click, rub or gallop Abdomen: soft, non-tender; bowel sounds normal; no masses,  no organomegaly Pelvic: cervix normal in appearance, external genitalia normal, no adnexal masses or tenderness, no cervical motion tenderness, uterus normal size, shape, and consistency and vagina normal without discharge Extremities: extremities normal, atraumatic, no cyanosis or edema Pulses: 2+ and symmetric Skin: Skin color, texture, turgor normal. No rashes or lesions Lymph nodes: Cervical, supraclavicular, and  axillary nodes normal. Neurologic: Grossly normal    Assessment:    Healthy female exam.      Plan:   Problem List Items Addressed This Visit      Unprioritized   FIBROADENOMA, BREAST - Primary   Relevant Orders   US BREAST LTD UNI LEFT INC AXILLA    Other Visit Diagnoses    Stress incontinence of urine       Relevant Orders   Ambulatory referral to Physical Therapy     Pap WNL 06/18 Return in 1 year (on 06/08/2019).    See After Visit Summary for Counseling Recommendations

## 2018-06-08 ENCOUNTER — Encounter: Payer: BC Managed Care – PPO | Admitting: Obstetrics & Gynecology

## 2018-06-13 ENCOUNTER — Ambulatory Visit
Admission: RE | Admit: 2018-06-13 | Discharge: 2018-06-13 | Disposition: A | Discharge status: Home / Self Care | Payer: BLUE CROSS/BLUE SHIELD | Source: Ambulatory Visit | Attending: Family Medicine | Admitting: Family Medicine

## 2018-06-13 DIAGNOSIS — N6022 Fibroadenosis of left breast: Secondary | ICD-10-CM

## 2018-06-13 DIAGNOSIS — N6489 Other specified disorders of breast: Secondary | ICD-10-CM | POA: Diagnosis not present

## 2018-06-13 DIAGNOSIS — R922 Inconclusive mammogram: Secondary | ICD-10-CM | POA: Diagnosis not present

## 2018-08-04 DIAGNOSIS — J3081 Allergic rhinitis due to animal (cat) (dog) hair and dander: Secondary | ICD-10-CM | POA: Diagnosis not present

## 2018-08-04 DIAGNOSIS — J3089 Other allergic rhinitis: Secondary | ICD-10-CM | POA: Diagnosis not present

## 2018-11-01 DIAGNOSIS — J301 Allergic rhinitis due to pollen: Secondary | ICD-10-CM | POA: Diagnosis not present

## 2018-11-01 DIAGNOSIS — Z91010 Allergy to peanuts: Secondary | ICD-10-CM | POA: Diagnosis not present

## 2018-11-01 DIAGNOSIS — J3089 Other allergic rhinitis: Secondary | ICD-10-CM | POA: Diagnosis not present

## 2018-12-29 ENCOUNTER — Emergency Department (HOSPITAL_BASED_OUTPATIENT_CLINIC_OR_DEPARTMENT_OTHER)
Admission: EM | Admit: 2018-12-29 | Discharge: 2018-12-29 | Disposition: A | Discharge status: Home / Self Care | Payer: BLUE CROSS/BLUE SHIELD | Attending: Emergency Medicine | Admitting: Emergency Medicine

## 2018-12-29 ENCOUNTER — Emergency Department (HOSPITAL_BASED_OUTPATIENT_CLINIC_OR_DEPARTMENT_OTHER): Payer: BLUE CROSS/BLUE SHIELD

## 2018-12-29 ENCOUNTER — Other Ambulatory Visit: Payer: Self-pay

## 2018-12-29 ENCOUNTER — Encounter (HOSPITAL_BASED_OUTPATIENT_CLINIC_OR_DEPARTMENT_OTHER): Payer: Self-pay | Admitting: Emergency Medicine

## 2018-12-29 DIAGNOSIS — R110 Nausea: Secondary | ICD-10-CM | POA: Diagnosis not present

## 2018-12-29 DIAGNOSIS — R112 Nausea with vomiting, unspecified: Secondary | ICD-10-CM | POA: Insufficient documentation

## 2018-12-29 DIAGNOSIS — R197 Diarrhea, unspecified: Secondary | ICD-10-CM

## 2018-12-29 DIAGNOSIS — R05 Cough: Secondary | ICD-10-CM | POA: Insufficient documentation

## 2018-12-29 DIAGNOSIS — R51 Headache: Secondary | ICD-10-CM | POA: Diagnosis not present

## 2018-12-29 DIAGNOSIS — E039 Hypothyroidism, unspecified: Secondary | ICD-10-CM | POA: Insufficient documentation

## 2018-12-29 DIAGNOSIS — R0981 Nasal congestion: Secondary | ICD-10-CM | POA: Diagnosis not present

## 2018-12-29 DIAGNOSIS — J111 Influenza due to unidentified influenza virus with other respiratory manifestations: Secondary | ICD-10-CM | POA: Diagnosis not present

## 2018-12-29 DIAGNOSIS — R6889 Other general symptoms and signs: Secondary | ICD-10-CM

## 2018-12-29 LAB — URINALYSIS, MICROSCOPIC (REFLEX)

## 2018-12-29 LAB — URINALYSIS, ROUTINE W REFLEX MICROSCOPIC
Bilirubin Urine: NEGATIVE
GLUCOSE, UA: NEGATIVE mg/dL
KETONES UR: 15 mg/dL — AB
LEUKOCYTE UA: NEGATIVE
NITRITE: NEGATIVE
PH: 6 (ref 5.0–8.0)
Protein, ur: NEGATIVE mg/dL

## 2018-12-29 LAB — PREGNANCY, URINE: Preg Test, Ur: NEGATIVE

## 2018-12-29 MED ORDER — LOPERAMIDE HCL 2 MG PO CAPS: 2.0000 mg | ORAL_CAPSULE | Freq: Four times a day (QID) | ORAL | 0 refills | Status: DC | PRN

## 2018-12-29 MED ORDER — LOPERAMIDE HCL 2 MG PO CAPS
ORAL_CAPSULE | ORAL | Status: AC
  Filled 2018-12-29: qty 1

## 2018-12-29 MED ORDER — PROMETHAZINE HCL 25 MG PO TABS: 25.0000 mg | ORAL_TABLET | Freq: Four times a day (QID) | ORAL | 0 refills | Status: AC | PRN

## 2018-12-29 MED ORDER — PROMETHAZINE HCL 25 MG PO TABS
25.0000 mg | ORAL_TABLET | Freq: Four times a day (QID) | ORAL | Status: DC | PRN
  Administered 2018-12-29: 25 mg via ORAL
  Filled 2018-12-29: qty 1

## 2018-12-29 NOTE — ED Triage Notes (Signed)
Pt having flu like symptoms since last Sunday not getting better with OTC medication. Having 4/10 back pain.

## 2018-12-29 NOTE — ED Notes (Signed)
Patient transported to X-ray 

## 2018-12-29 NOTE — Discharge Instructions (Signed)

## 2018-12-29 NOTE — ED Provider Notes (Signed)
Emergency Department Provider Note   I have reviewed the triage vital signs and the nursing notes.   HISTORY  Chief Complaint Influenza   HPI Catherine Mckenzie is a 38 y.o. female with PMH of migraine HA presents to the ED with 5 days of flu-like symptoms, vomiting, and diarrhea.  Patient denies any bloody vomiting or diarrhea.  She has had headache, congestion, cough.  No significant shortness of breath.  No lightheadedness or near syncope.  Patient is eating and drinking well but will often have diarrhea after eating.  No radiation of symptoms or other modifying factors.  Past Medical History:  Diagnosis Date  . Allergy   . Fibroadenoma    left breast  . FIBROADENOMA, BREAST 04/27/2007   Qualifier: Diagnosis of  By: Fuller Plan CMA (AAMA), [place]    . Migraine headache without aura   . Thyroid disease     Patient Active Problem List   Diagnosis Date Noted  . Trochanteric bursitis, left hip 11/24/2017  . Cholelithiasis 05/22/2015  . Celiac sprue 04/03/2013  . Obesity 07/05/2012  . Migraine headache without aura   . Hypothyroidism 04/27/2007  . FIBROADENOMA, BREAST 04/27/2007    Past Surgical History:  Procedure Laterality Date  . CESAREAN SECTION    . CHOLECYSTECTOMY N/A 05/23/2015   Procedure: LAPAROSCOPIC CHOLECYSTECTOMY;  Surgeon: Fanny Skates, MD;  Location: [facility];  Service: General;  Laterality: N/A;  . ERCP N/A 05/24/2015   Procedure: ENDOSCOPIC RETROGRADE CHOLANGIOPANCREATOGRAPHY (ERCP);  Surgeon: Carol Ada, MD;  Location: [facility];  Service: Endoscopy;  Laterality: N/A;  . WISDOM TOOTH EXTRACTION     x4    Allergies Levaquin [levofloxacin]; Moxifloxacin; Other; Soy allergy; Sulfur; Wheat bran; Benzocaine; and Benzoyl peroxide-erythromycin  Family History  Problem Relation Age of Onset  . Diabetes Mother   . Hypertension Mother   . Hyperlipidemia Mother   . Stroke Mother        TIA  . Deep vein thrombosis Mother   . Heart disease Mother   .  Asthma Sister   . Depression Sister   . Cancer Maternal Grandmother 50       PANCREATIC  . Heart disease Maternal Grandfather   . Hypertension Maternal Grandfather   . Hypertension Paternal Grandmother   . Cancer Paternal Grandmother        BREAST  . Stroke Paternal Grandmother   . Cancer Paternal Grandfather        PROSTATE  . Seizures Paternal Grandfather   . Hypertension Paternal Grandfather   . Stroke Paternal Grandfather     Social History Social History   Tobacco Use  . Smoking status: Never Smoker  . Smokeless tobacco: Never Used  Substance Use Topics  . Alcohol use: Yes    Comment: rarely  . Drug use: No    Review of Systems  Constitutional: Positive fever/chills and body aches.  Eyes: No visual changes. ENT: Positive sore throat and congestion.  Cardiovascular: Denies chest pain. Respiratory: Denies shortness of breath. Positive cough.  Gastrointestinal: No abdominal pain. Positive nausea, no vomiting. Positive diarrhea.  No constipation. Genitourinary: Negative for dysuria. Musculoskeletal: Negative for back pain. Skin: Negative for rash. Neurological: Negative for focal weakness or numbness. Positive HA.   10-point ROS otherwise negative.  ____________________________________________   PHYSICAL EXAM:  VITAL SIGNS: ED Triage Vitals  Enc Vitals Group     BP 12/29/18 2012 129/81     Pulse Rate 12/29/18 2012 (!) 102     Resp 12/29/18 2012 16  Temp 12/29/18 2012 98.3 F (36.8 C)     Temp Source 12/29/18 2012 Oral     SpO2 12/29/18 2012 100 %     Weight 12/29/18 2012 200 lb 14.4 oz (91.1 kg)     Height 12/29/18 2012 5\' 7"  (1.702 m)     Pain Score 12/29/18 2011 4   Constitutional: Alert and oriented. Well appearing and in no acute distress. Eyes: Conjunctivae are normal.  Head: Atraumatic. Nose: Positive congestion/rhinnorhea. Mouth/Throat: Mucous membranes are moist.  Oropharynx with mild erythema. No exudate or PTA.  Neck: No stridor.    Cardiovascular: Normal rate, regular rhythm. Good peripheral circulation. Grossly normal heart sounds.   Respiratory: Normal respiratory effort.  No retractions. Lungs CTAB. Gastrointestinal: Soft and nontender. No distention. No CVA tenderness.  Musculoskeletal: No lower extremity tenderness nor edema. No gross deformities of extremities. Neurologic:  Normal speech and language. No gross focal neurologic deficits are appreciated.  Skin:  Skin is warm, dry and intact. No rash noted.   ____________________________________________   LABS (all labs ordered are listed, but only abnormal results are displayed)  Labs Reviewed  URINALYSIS, ROUTINE W REFLEX MICROSCOPIC - Abnormal; Notable for the following components:      Result Value   Specific Gravity, Urine <1.005 (*)    Hgb urine dipstick SMALL (*)    Ketones, ur 15 (*)    All other components within normal limits  URINALYSIS, MICROSCOPIC (REFLEX) - Abnormal; Notable for the following components:   Bacteria, UA RARE (*)    All other components within normal limits  PREGNANCY, URINE   ____________________________________________  RADIOLOGY  Dg Chest 2 View  Result Date: 12/29/2018 CLINICAL DATA:  Cough, fever, back pain EXAM: CHEST - 2 VIEW COMPARISON:  12/08/2016 FINDINGS: Heart and mediastinal contours are within normal limits. No focal opacities or effusions. No acute bony abnormality. IMPRESSION: No active cardiopulmonary disease. Electronically Signed   By: Rolm Baptise M.D.   On: 12/29/2018 21:38    ____________________________________________   PROCEDURES  Procedure(s) performed:   Procedures  None ____________________________________________   INITIAL IMPRESSION / ASSESSMENT AND PLAN / ED COURSE  Pertinent labs & imaging results that were available during my care of the patient were reviewed by me and considered in my medical decision making (see chart for details).  Patient presents to the emergency  department with flulike symptoms for the past 5 days.  She is outside the window to benefit from Tamiflu.  She is sitting up and well-appearing.  Abdomen is soft and nontender.  No indication for abdominal imaging or additional blood work.  Urine analysis and chest x-ray reviewed without acute findings.  Plan for supportive care at home.  Medications provided.  Discussed ED return precautions in detail.   ____________________________________________  FINAL CLINICAL IMPRESSION(S) / ED DIAGNOSES  Final diagnoses:  Flu-like symptoms  Nausea  Diarrhea, unspecified type     NEW OUTPATIENT MEDICATIONS STARTED DURING THIS VISIT:  Discharge Medication List as of 12/29/2018  9:49 PM    START taking these medications   Details  loperamide (IMODIUM) 2 MG capsule Take 1 capsule (2 mg total) by mouth 4 (four) times daily as needed for diarrhea or loose stools., Starting Thu 12/29/2018, Print    promethazine (PHENERGAN) 25 MG tablet Take 1 tablet (25 mg total) by mouth every 6 (six) hours as needed for nausea or vomiting., Starting Thu 12/29/2018, Print        Note:  This document was prepared using Dragon voice recognition  software and may include unintentional dictation errors.  Nanda Quinton, MD Emergency Medicine    Irene Collings, [facility], MD 12/30/18 [phone]

## 2019-01-23 DIAGNOSIS — J301 Allergic rhinitis due to pollen: Secondary | ICD-10-CM | POA: Diagnosis not present

## 2019-04-04 ENCOUNTER — Other Ambulatory Visit: Payer: Self-pay | Admitting: Family Medicine

## 2019-04-04 DIAGNOSIS — Z30011 Encounter for initial prescription of contraceptive pills: Secondary | ICD-10-CM

## 2019-08-10 DIAGNOSIS — R197 Diarrhea, unspecified: Secondary | ICD-10-CM | POA: Diagnosis not present

## 2019-08-15 DIAGNOSIS — R194 Change in bowel habit: Secondary | ICD-10-CM | POA: Diagnosis not present

## 2019-08-15 DIAGNOSIS — K625 Hemorrhage of anus and rectum: Secondary | ICD-10-CM | POA: Diagnosis not present

## 2019-08-15 DIAGNOSIS — K580 Irritable bowel syndrome with diarrhea: Secondary | ICD-10-CM | POA: Diagnosis not present

## 2019-08-16 ENCOUNTER — Other Ambulatory Visit: Payer: Self-pay

## 2019-08-16 ENCOUNTER — Other Ambulatory Visit: Payer: Self-pay | Admitting: Family Medicine

## 2019-08-16 ENCOUNTER — Ambulatory Visit
Admission: RE | Admit: 2019-08-16 | Discharge: 2019-08-16 | Disposition: A | Discharge status: Home / Self Care | Payer: BC Managed Care – PPO | Source: Ambulatory Visit | Attending: Family Medicine | Admitting: Family Medicine

## 2019-08-16 DIAGNOSIS — R1084 Generalized abdominal pain: Secondary | ICD-10-CM

## 2019-08-16 DIAGNOSIS — K7689 Other specified diseases of liver: Secondary | ICD-10-CM | POA: Diagnosis not present

## 2019-08-16 MED ORDER — IOPAMIDOL (ISOVUE-300) INJECTION 61%
100.0000 mL | Freq: Once | INTRAVENOUS | Status: AC | PRN
  Administered 2019-08-16: 100 mL via INTRAVENOUS

## 2019-08-16 NOTE — Progress Notes (Signed)
INtermittent worsening bouts of ABD pain and diarrhea. On Flagyl w/ slow improvement but acute worsening today after starting Xifaxan and new Probiotic 1 day ago. Eval by Telemed. Dr. Collene Mares saw pt one day ago in clinic and pt reports ttp of abd. Today copious loose stool w/o hematochezia, BRBPR, melena. Nausea w/ oral intake but feels able to stay hydrated. Using Zofran and Phenergan PRN for sx.   Sending pt for CT abdplv w/ and w/o. Pt to pretreat w/ Zofran.

## 2019-09-13 ENCOUNTER — Other Ambulatory Visit: Payer: Self-pay | Admitting: Family Medicine

## 2019-09-13 DIAGNOSIS — K769 Liver disease, unspecified: Secondary | ICD-10-CM

## 2019-09-13 NOTE — Progress Notes (Signed)
See previous CT scan w/ Radiology recommendation for MRI w/ and w/o contrast to evaluate wedge-shaped low density in the R hepatic lobe. No h/o hepatic disease.

## 2019-10-11 ENCOUNTER — Ambulatory Visit
Admission: RE | Admit: 2019-10-11 | Discharge: 2019-10-11 | Disposition: A | Discharge status: Home / Self Care | Payer: BC Managed Care – PPO | Source: Ambulatory Visit | Attending: Family Medicine | Admitting: Family Medicine

## 2019-10-11 ENCOUNTER — Other Ambulatory Visit: Payer: Self-pay

## 2019-10-11 DIAGNOSIS — K769 Liver disease, unspecified: Secondary | ICD-10-CM

## 2019-10-11 DIAGNOSIS — K7689 Other specified diseases of liver: Secondary | ICD-10-CM | POA: Diagnosis not present

## 2019-10-11 MED ORDER — GADOBENATE DIMEGLUMINE 529 MG/ML IV SOLN
19.0000 mL | Freq: Once | INTRAVENOUS | Status: AC | PRN
  Administered 2019-10-11: 11:00:00 19 mL via INTRAVENOUS

## 2019-12-22 ENCOUNTER — Ambulatory Visit: Payer: BC Managed Care – PPO | Attending: Internal Medicine

## 2019-12-22 DIAGNOSIS — Z20822 Contact with and (suspected) exposure to covid-19: Secondary | ICD-10-CM

## 2019-12-23 LAB — SPECIMEN STATUS REPORT

## 2019-12-23 LAB — NOVEL CORONAVIRUS, NAA: SARS-CoV-2, NAA: NOT DETECTED

## 2020-03-03 DIAGNOSIS — U071 COVID-19: Secondary | ICD-10-CM | POA: Diagnosis not present

## 2020-03-03 DIAGNOSIS — B349 Viral infection, unspecified: Secondary | ICD-10-CM | POA: Diagnosis not present

## 2020-03-04 ENCOUNTER — Other Ambulatory Visit: Payer: Self-pay | Admitting: Family Medicine

## 2020-03-04 DIAGNOSIS — Z30011 Encounter for initial prescription of contraceptive pills: Secondary | ICD-10-CM

## 2020-03-09 ENCOUNTER — Emergency Department (HOSPITAL_COMMUNITY)
Admission: EM | Admit: 2020-03-09 | Discharge: 2020-03-10 | Disposition: A | Discharge status: Home / Self Care | Payer: BC Managed Care – PPO | Attending: Emergency Medicine | Admitting: Emergency Medicine

## 2020-03-09 ENCOUNTER — Other Ambulatory Visit: Payer: Self-pay

## 2020-03-09 DIAGNOSIS — U071 COVID-19: Secondary | ICD-10-CM | POA: Insufficient documentation

## 2020-03-09 DIAGNOSIS — E860 Dehydration: Secondary | ICD-10-CM | POA: Diagnosis not present

## 2020-03-09 DIAGNOSIS — E079 Disorder of thyroid, unspecified: Secondary | ICD-10-CM | POA: Insufficient documentation

## 2020-03-09 DIAGNOSIS — R110 Nausea: Secondary | ICD-10-CM | POA: Insufficient documentation

## 2020-03-09 DIAGNOSIS — R531 Weakness: Secondary | ICD-10-CM | POA: Diagnosis not present

## 2020-03-09 DIAGNOSIS — Z79899 Other long term (current) drug therapy: Secondary | ICD-10-CM | POA: Diagnosis not present

## 2020-03-09 DIAGNOSIS — R1084 Generalized abdominal pain: Secondary | ICD-10-CM | POA: Diagnosis not present

## 2020-03-09 DIAGNOSIS — R197 Diarrhea, unspecified: Secondary | ICD-10-CM | POA: Diagnosis not present

## 2020-03-09 LAB — COMPREHENSIVE METABOLIC PANEL
ALT: 58 U/L — ABNORMAL HIGH (ref 0–44)
AST: 56 U/L — ABNORMAL HIGH (ref 15–41)
Albumin: 3.4 g/dL — ABNORMAL LOW (ref 3.5–5.0)
Alkaline Phosphatase: 56 U/L (ref 38–126)
Anion gap: 13 (ref 5–15)
BUN: 9 mg/dL (ref 6–20)
CO2: 21 mmol/L — ABNORMAL LOW (ref 22–32)
Calcium: 8.6 mg/dL — ABNORMAL LOW (ref 8.9–10.3)
Chloride: 102 mmol/L (ref 98–111)
Creatinine, Ser: 0.65 mg/dL (ref 0.44–1.00)
GFR calc Af Amer: >60 mL/min (ref >60)
GFR calc non Af Amer: >60 mL/min (ref >60)
Glucose, Bld: 155 mg/dL — ABNORMAL HIGH (ref 70–99)
Potassium: 4.2 mmol/L (ref 3.5–5.1)
Sodium: 136 mmol/L (ref 135–145)
Total Bilirubin: 0.7 mg/dL (ref 0.3–1.2)
Total Protein: 7.4 g/dL (ref 6.5–8.1)

## 2020-03-09 LAB — I-STAT BETA HCG BLOOD, ED (MC, WL, AP ONLY): I-stat hCG, quantitative: <5 m[IU]/mL (ref <5)

## 2020-03-09 LAB — CBC
HCT: 48 % — ABNORMAL HIGH (ref 36.0–46.0)
Hemoglobin: 15.6 g/dL — ABNORMAL HIGH (ref 12.0–15.0)
MCH: 30.4 pg (ref 26.0–34.0)
MCHC: 32.5 g/dL (ref 30.0–36.0)
MCV: 93.4 fL (ref 80.0–100.0)
Platelets: 208 10*3/uL (ref 150–400)
RBC: 5.14 MIL/uL — ABNORMAL HIGH (ref 3.87–5.11)
RDW: 12.7 % (ref 11.5–15.5)
WBC: 3.7 10*3/uL — ABNORMAL LOW (ref 4.0–10.5)
nRBC: 0 % (ref 0.0–0.2)

## 2020-03-09 MED ORDER — SODIUM CHLORIDE 0.9% FLUSH: 3.0000 mL | Freq: Once | INTRAVENOUS | Status: DC

## 2020-03-09 NOTE — ED Triage Notes (Signed)
Pt was covid + a week ago and has had diarrhea and nausea for the past 8 days. Pt reports generalized weakness and fatigue

## 2020-03-10 MED ORDER — ALBUTEROL SULFATE HFA 108 (90 BASE) MCG/ACT IN AERS: 1.0000 | INHALATION_SPRAY | Freq: Four times a day (QID) | RESPIRATORY_TRACT | 0 refills | Status: AC | PRN

## 2020-03-10 MED ORDER — BENZONATATE 100 MG PO CAPS: 100.0000 mg | ORAL_CAPSULE | Freq: Three times a day (TID) | ORAL | 0 refills | Status: DC | PRN

## 2020-03-10 NOTE — Discharge Instructions (Addendum)
Drink plenty water and get rest.  Use Tessalon as needed for cough.  You can also use the albuterol inhaler 1 to 2 puffs every 4-6 hours as needed.  You can continue taking Mucinex.  Make sure you take it with a full glass of water.  You can take Imodium as needed for diarrhea if this becomes very bothersome to you.  Follow-up with your primary care provider for reevaluation of your symptoms.  Return to the emergency department immediately if any concerning signs or symptoms develop such as low oxygen saturations (less than 92%), shortness of breath, chest pain, persistent vomiting, severe abdominal pains, bloody diarrhea, or loss of consciousness.  You can return to work/complete quarantine after you have had symptoms for 10 days if your symptoms are improving at that point and you have been fever free without the use of ibuprofen or Tylenol for 24 hours at least.  Otherwise you will need to continue quarantining.``````````````````````````````````````````````````````````````````````

## 2020-03-10 NOTE — ED Notes (Signed)
Patient verbalizes understanding of discharge instructions. Opportunity for questioning and answers were provided. Armband removed by staff, pt discharged from ED. Pt. ambulatory and discharged home.  

## 2020-03-10 NOTE — ED Provider Notes (Signed)
[facility] EMERGENCY DEPARTMENT Provider Note   CSN: [id number] Arrival date & time: 03/09/20  2011     History Chief Complaint  Patient presents with  . Diarrhea    Catherine Mckenzie is a 39 y.o. female with history of migraine headaches, thyroid disease, celiac sprue presents for evaluation of acute onset, persistent diarrhea for 1 week.  She reports that last Saturday she developed symptoms and shortly thereafter tested positive for COVID-19.  She reports around 3 episodes of watery nonbloody diarrhea daily, intermittent generalized abdominal cramping, nausea but no vomiting.  She has had a nonproductive cough but reports that her oxygen saturations have been within normal limits when she has been checking it at home and she does not feel significantly short of breath.  She has been prescribed a course of Decadron and has been taking Mucinex for her symptoms.  She also started a Z-Pak yesterday.  She has been trying to drink water and stay hydrated but has been eating and drinking less.  She reports feeling generally very weak and has had lightheadedness with standing but no syncope.  She denies urinary symptoms.  Her entire family has tested positive for COVID-19.  She is a non-smoker.  She is status post cholecystectomy.  The history is provided by the patient.       Past Medical History:  Diagnosis Date  . Allergy   . Fibroadenoma    left breast  . FIBROADENOMA, BREAST 04/27/2007   Qualifier: Diagnosis of  By: Fuller Plan CMA (AAMA), Lugene    . Migraine headache without aura   . Thyroid disease     Patient Active Problem List   Diagnosis Date Noted  . Trochanteric bursitis, left hip 11/24/2017  . Cholelithiasis 05/22/2015  . Celiac sprue 04/03/2013  . Obesity 07/05/2012  . Migraine headache without aura   . Hypothyroidism 04/27/2007  . FIBROADENOMA, BREAST 04/27/2007    Past Surgical History:  Procedure Laterality Date  . CESAREAN SECTION    .  CHOLECYSTECTOMY N/A 05/23/2015   Procedure: LAPAROSCOPIC CHOLECYSTECTOMY;  Surgeon: Fanny Skates, MD;  Location: [facility];  Service: General;  Laterality: N/A;  . ERCP N/A 05/24/2015   Procedure: ENDOSCOPIC RETROGRADE CHOLANGIOPANCREATOGRAPHY (ERCP);  Surgeon: Carol Ada, MD;  Location: [facility];  Service: Endoscopy;  Laterality: N/A;  . WISDOM TOOTH EXTRACTION     x4     OB History    Gravida  3   Para  2   Term  2   Preterm      AB  1   Living  1     SAB  1   TAB      Ectopic      Multiple  0   Live Births  1           Family History  Problem Relation Age of Onset  . Diabetes Mother   . Hypertension Mother   . Hyperlipidemia Mother   . Stroke Mother        TIA  . Deep vein thrombosis Mother   . Heart disease Mother   . Asthma Sister   . Depression Sister   . Cancer Maternal Grandmother 50       PANCREATIC  . Heart disease Maternal Grandfather   . Hypertension Maternal Grandfather   . Hypertension Paternal Grandmother   . Cancer Paternal Grandmother        BREAST  . Stroke Paternal Grandmother   . Cancer Paternal Grandfather  PROSTATE  . Seizures Paternal Grandfather   . Hypertension Paternal Grandfather   . Stroke Paternal Grandfather     Social History   Tobacco Use  . Smoking status: Never Smoker  . Smokeless tobacco: Never Used  Substance Use Topics  . Alcohol use: Yes    Comment: rarely  . Drug use: No    Home Medications Prior to Admission medications   Medication Sig Start Date End Date Taking? Authorizing Provider  acetaminophen (TYLENOL) 500 MG tablet Take 1,000 mg by mouth every 6 (six) hours as needed. For migraine    [provider]  albuterol (VENTOLIN HFA) 108 (90 Base) MCG/ACT inhaler Inhale 1-2 puffs into the lungs every 6 (six) hours as needed for wheezing or shortness of breath. 03/10/20   Catherene Kaleta A, PA-C  ARMOUR THYROID 60 MG tablet TAKE 2 TABLETS (120 MG TOTAL) BY MOUTH DAILY BEFORE BREAKFAST.  Patient taking differently: Take 1 tablet (60 mg) by mouth daily before breakfast. 04/04/15   Donnamae Jude, MD  BALZIVA 0.4-35 MG-MCG tablet TAKE 1 TABLET BY MOUTH EVERY DAY 03/05/20   Truett Mainland, DO  benzonatate (TESSALON) 100 MG capsule Take 1 capsule (100 mg total) by mouth 3 (three) times daily as needed for cough. 03/10/20   Zyden Suman A, PA-C  cetirizine (ZYRTEC) 10 MG tablet Take 10 mg by mouth daily.    [provider]  EPIPEN 2-PAK 0.3 MG/0.3ML SOAJ Inject 0.03 mg into the muscle as needed (allergies.).  03/01/13   [provider]  ergocalciferol (DRISDOL) 8000 UNIT/ML drops Take 3,000 Units by mouth daily.     [provider]  ibuprofen (ADVIL,MOTRIN) 200 MG tablet Take by mouth every 6 (six) hours as needed for fever or mild pain. As directed, per package instructions.    [provider]  loperamide (IMODIUM) 2 MG capsule Take 1 capsule (2 mg total) by mouth 4 (four) times daily as needed for diarrhea or loose stools. 12/29/18   Long, Wonda Olds, MD  Multiple Vitamin (MULTI-VITAMINS) TABS Take 1 tablet by mouth daily.    [provider]  promethazine (PHENERGAN) 25 MG tablet Take 1 tablet (25 mg total) by mouth every 6 (six) hours as needed for nausea or vomiting. 12/29/18   Long, Wonda Olds, MD  Tuberculin-Allergy Syringes (ALLERGY SYRINGE 1CC/27GX1/2") 27G X 1/2" 1 ML MISC 1 each by Does not apply route once a week. Pt stated that she gets allergy injections weekly    [provider]  valACYclovir (VALTREX) 1000 MG tablet Take 0.5 tablets (500 mg total) by mouth daily. 12/14/17   Donnamae Jude, MD    Allergies    Levaquin [levofloxacin], Moxifloxacin, Other, Soy allergy, Sulfur, Wheat bran, Benzocaine, and Benzoyl peroxide-erythromycin  Review of Systems   Review of Systems  Constitutional: Positive for fatigue.  Respiratory: Positive for cough.   Gastrointestinal: Positive for abdominal pain, diarrhea and nausea. Negative for  vomiting.  Neurological: Positive for light-headedness. Negative for syncope.  All other systems reviewed and are negative.   Physical Exam Updated Vital Signs BP 127/88   Pulse 84   Temp 98.7 F (37.1 C) (Oral)   Resp 12   Ht 5\' 7"  (1.702 m)   Wt 83.9 kg   LMP 02/19/2020   SpO2 98%   BMI 28.98 kg/m   Physical Exam Vitals and nursing note reviewed.  Constitutional:      General: She is not in acute distress.    Appearance: She is  well-developed.     Comments: Resting comfortably in bed  HENT:     Head: Normocephalic and atraumatic.  Eyes:     General:        Right eye: No discharge.        Left eye: No discharge.     Conjunctiva/sclera: Conjunctivae normal.  Neck:     Vascular: No JVD.     Trachea: No tracheal deviation.  Cardiovascular:     Rate and Rhythm: Normal rate and regular rhythm.     Pulses: Normal pulses.     Heart sounds: Normal heart sounds.  Pulmonary:     Effort: Pulmonary effort is normal.     Breath sounds: Rales present.     Comments: Bibasilar crackles noted.  Speaking in full sentences without difficulty, frequent dry cough.  SPO2 saturations 100% on room air. Abdominal:     General: Abdomen is flat. Bowel sounds are normal. There is no distension.     Palpations: Abdomen is soft.     Tenderness: There is no abdominal tenderness. There is no right CVA tenderness, left CVA tenderness, guarding or rebound.  Musculoskeletal:     Cervical back: Normal range of motion and neck supple.  Skin:    General: Skin is warm and dry.     Findings: No erythema.  Neurological:     Mental Status: She is alert.  Psychiatric:        Behavior: Behavior normal.     ED Results / Procedures / Treatments   Labs (all labs ordered are listed, but only abnormal results are displayed) Labs Reviewed  COMPREHENSIVE METABOLIC PANEL - Abnormal; Notable for the following components:      Result Value   CO2 21 (*)    Glucose, Bld 155 (*)    Calcium 8.6 (*)     Albumin 3.4 (*)    AST 56 (*)    ALT 58 (*)    All other components within normal limits  CBC - Abnormal; Notable for the following components:   WBC 3.7 (*)    RBC 5.14 (*)    Hemoglobin 15.6 (*)    HCT 48.0 (*)    All other components within normal limits  I-STAT BETA HCG BLOOD, ED ([facility], [facility], [facility] ONLY)    EKG None  Radiology No results found.  Procedures Procedures (including critical care time)  Medications Ordered in ED Medications - No data to display  ED Course  I have reviewed the triage vital signs and the nursing notes.  Pertinent labs & imaging results that were available during my care of the patient were reviewed by me and considered in my medical decision making (see chart for details).    MDM Rules/Calculators/A&P                      Catherine Mckenzie was evaluated in Emergency Department on 03/11/2020 for the symptoms described in the history of present illness. She was evaluated in the context of the global COVID-19 pandemic, which necessitated consideration that the patient might be at risk for infection with the SARS-CoV-2 virus that causes COVID-19. Institutional protocols and algorithms that pertain to the evaluation of patients at risk for COVID-19 are in a state of rapid change based on information released by regulatory bodies including the CDC and federal and state organizations. These policies and algorithms were followed during the patient's care in the ED.  Patient presenting for evaluation of fatigue, diarrhea, cough.  Tested positive for Covid earlier this week.  In the ED she is afebrile, initially tachycardic on triage but vital signs otherwise within normal limits on multiple subsequent reevaluations.  She is nontoxic, nonseptic in appearance on my assessment.  Her abdomen is soft and entirely nontender with no peritoneal signs.  She has had nausea but no vomiting.  Doubt acute surgical abdominal pathology given reassuring physical examination.  Lab  work reviewed and interpreted by myself shows no leukocytosis, elevated hemoglobin and hematocrit suggesting hemoconcentration/dehydration.  No renal insufficiency on CMP but she does have mild elevation in her AST and ALT which is not unusual in the setting of COVID-19 infection.  On auscultation of the lungs she has bibasilar crackles but is speaking in full sentences with no evidence of respiratory distress and stable SPO2 saturations in the ED.  She also notes stable SPO2 saturations at home.  She has a frequent cough, will offer her an albuterol inhaler and Tessalon for symptomatic management.  We discussed work-up and findings, offered IV fluids versus oral rehydration.  She would like to try to focus on rehydration orally at home which I think is reasonable given she has been tolerating p.o. up until this point.  She is able to tolerate p.o. in the ED as well.  Serial abdominal examinations remained benign.  Discussed symptomatic management, close PCP follow-up and quarantining at home per current CDC guidelines.  Discussed strict ED return precautions.  Patient verbalized understanding of and agreement with plan and patient is stable for discharge at this time.  Final Clinical Impression(s) / ED Diagnoses Final diagnoses:  COVID-19  Diarrhea of presumed infectious origin  Dehydration    Rx / DC Orders ED Discharge Orders         Ordered    albuterol (VENTOLIN HFA) 108 (90 Base) MCG/ACT inhaler  Every 6 hours PRN     03/10/20 0537    benzonatate (TESSALON) 100 MG capsule  3 times daily PRN     03/10/20 0537           Renita Papa, PA-C 03/11/20 0217    Fatima Blank, MD 03/11/20 [id number]

## 2020-03-14 DIAGNOSIS — E860 Dehydration: Secondary | ICD-10-CM | POA: Diagnosis not present

## 2020-03-14 DIAGNOSIS — U071 COVID-19: Secondary | ICD-10-CM | POA: Diagnosis not present

## 2020-04-14 ENCOUNTER — Other Ambulatory Visit: Payer: Self-pay

## 2020-04-14 ENCOUNTER — Encounter (HOSPITAL_BASED_OUTPATIENT_CLINIC_OR_DEPARTMENT_OTHER): Payer: Self-pay | Admitting: *Deleted

## 2020-04-14 ENCOUNTER — Emergency Department (HOSPITAL_BASED_OUTPATIENT_CLINIC_OR_DEPARTMENT_OTHER)
Admission: EM | Admit: 2020-04-14 | Discharge: 2020-04-14 | Disposition: A | Discharge status: Home / Self Care | Payer: BC Managed Care – PPO | Attending: Emergency Medicine | Admitting: Emergency Medicine

## 2020-04-14 DIAGNOSIS — Z8616 Personal history of COVID-19: Secondary | ICD-10-CM | POA: Insufficient documentation

## 2020-04-14 DIAGNOSIS — M25561 Pain in right knee: Secondary | ICD-10-CM | POA: Diagnosis not present

## 2020-04-14 DIAGNOSIS — R2242 Localized swelling, mass and lump, left lower limb: Secondary | ICD-10-CM | POA: Insufficient documentation

## 2020-04-14 DIAGNOSIS — M25562 Pain in left knee: Secondary | ICD-10-CM | POA: Diagnosis not present

## 2020-04-14 DIAGNOSIS — E039 Hypothyroidism, unspecified: Secondary | ICD-10-CM | POA: Diagnosis not present

## 2020-04-14 DIAGNOSIS — Z79899 Other long term (current) drug therapy: Secondary | ICD-10-CM | POA: Diagnosis not present

## 2020-04-14 DIAGNOSIS — R509 Fever, unspecified: Secondary | ICD-10-CM | POA: Insufficient documentation

## 2020-04-14 DIAGNOSIS — R21 Rash and other nonspecific skin eruption: Secondary | ICD-10-CM | POA: Insufficient documentation

## 2020-04-14 DIAGNOSIS — S80862A Insect bite (nonvenomous), left lower leg, initial encounter: Secondary | ICD-10-CM | POA: Diagnosis not present

## 2020-04-14 DIAGNOSIS — M79605 Pain in left leg: Secondary | ICD-10-CM

## 2020-04-14 DIAGNOSIS — M79604 Pain in right leg: Secondary | ICD-10-CM | POA: Insufficient documentation

## 2020-04-14 DIAGNOSIS — W57XXXA Bitten or stung by nonvenomous insect and other nonvenomous arthropods, initial encounter: Secondary | ICD-10-CM | POA: Diagnosis not present

## 2020-04-14 MED ORDER — ENOXAPARIN SODIUM 100 MG/ML ~~LOC~~ SOLN
1.0000 mg/kg | Freq: Once | SUBCUTANEOUS | Status: AC
  Administered 2020-04-14: 85 mg via SUBCUTANEOUS
  Filled 2020-04-14: qty 1

## 2020-04-14 NOTE — Discharge Instructions (Signed)
You should return to the ER tomorrow at 10 am for an ultrasound for DVT in your left leg.  We gave you a shot of blood thinner called lovenox in the ER tonight to provide overnight coverage for you.

## 2020-04-14 NOTE — ED Provider Notes (Signed)
[place] EMERGENCY DEPARTMENT Provider Note   CSN: [id number] Arrival date & time: 04/14/20  2016     History Chief Complaint  Patient presents with  . Leg Pain    Catherine Mckenzie is a 39 y.o. female with history of migraines, Covid illness in April 2021, presented to ED with rash and left knee pain.  Patient reports she had a possible insect bite to the left lower leg approximately 2 to 3 weeks ago.  This left a circular rash.  The rash resolved and then came back several days later.  She went to an urgent care today, and they drew blood work for Lyme disease, started on a course of doxycycline.  She is however concerned that she is also been having pain in her bilateral legs behind the knees, left more than the right.  She and her family feel like this leg is swollen.  There is a family history of DVT in her mother.  The patient herself was on oral contraceptives until proximately 6 weeks ago when she was diagnosed with Covid, then stopped taking her contraceptives out of concerns for clot development.  She herself has never had a DVT.  No PE, SOB.  She feels subjective fevers at home, no chills.   No other joint pain She can walk and bear weight on her knees  Her legs hurt worse at night in bed.  HPI      Past Medical History:  Diagnosis Date  . Allergy   . Fibroadenoma    left breast  . FIBROADENOMA, BREAST 04/27/2007   Qualifier: Diagnosis of  By: Fuller Plan CMA (AAMA), Lugene    . Migraine headache without aura   . Thyroid disease     Patient Active Problem List   Diagnosis Date Noted  . Trochanteric bursitis, left hip 11/24/2017  . Cholelithiasis 05/22/2015  . Celiac sprue 04/03/2013  . Obesity 07/05/2012  . Migraine headache without aura   . Hypothyroidism 04/27/2007  . FIBROADENOMA, BREAST 04/27/2007    Past Surgical History:  Procedure Laterality Date  . CESAREAN SECTION    . CHOLECYSTECTOMY N/A 05/23/2015   Procedure: LAPAROSCOPIC  CHOLECYSTECTOMY;  Surgeon: Fanny Skates, MD;  Location: [facility];  Service: General;  Laterality: N/A;  . ERCP N/A 05/24/2015   Procedure: ENDOSCOPIC RETROGRADE CHOLANGIOPANCREATOGRAPHY (ERCP);  Surgeon: Carol Ada, MD;  Location: [facility];  Service: Endoscopy;  Laterality: N/A;  . WISDOM TOOTH EXTRACTION     x4     OB History    Gravida  3   Para  2   Term  2   Preterm      AB  1   Living  1     SAB  1   TAB      Ectopic      Multiple  0   Live Births  1           Family History  Problem Relation Age of Onset  . Diabetes Mother   . Hypertension Mother   . Hyperlipidemia Mother   . Stroke Mother        TIA  . Deep vein thrombosis Mother   . Heart disease Mother   . Asthma Sister   . Depression Sister   . Cancer Maternal Grandmother 50       PANCREATIC  . Heart disease Maternal Grandfather   . Hypertension Maternal Grandfather   . Hypertension Paternal Grandmother   . Cancer Paternal Grandmother  BREAST  . Stroke Paternal Grandmother   . Cancer Paternal Grandfather        PROSTATE  . Seizures Paternal Grandfather   . Hypertension Paternal Grandfather   . Stroke Paternal Grandfather     Social History   Tobacco Use  . Smoking status: Never Smoker  . Smokeless tobacco: Never Used  Substance Use Topics  . Alcohol use: Yes    Comment: rarely  . Drug use: No    Home Medications Prior to Admission medications   Medication Sig Start Date End Date Taking? Authorizing Provider  acetaminophen (TYLENOL) 500 MG tablet Take 1,000 mg by mouth every 6 (six) hours as needed. For migraine    [provider]  albuterol (VENTOLIN HFA) 108 (90 Base) MCG/ACT inhaler Inhale 1-2 puffs into the lungs every 6 (six) hours as needed for wheezing or shortness of breath. 03/10/20   Fawze, Mina A, PA-C  ARMOUR THYROID 60 MG tablet TAKE 2 TABLETS (120 MG TOTAL) BY MOUTH DAILY BEFORE BREAKFAST. Patient taking differently: Take 1 tablet (60 mg) by mouth  daily before breakfast. 04/04/15   Donnamae Jude, MD  BALZIVA 0.4-35 MG-MCG tablet TAKE 1 TABLET BY MOUTH EVERY DAY 03/05/20   Truett Mainland, DO  benzonatate (TESSALON) 100 MG capsule Take 1 capsule (100 mg total) by mouth 3 (three) times daily as needed for cough. 03/10/20   Fawze, Mina A, PA-C  cetirizine (ZYRTEC) 10 MG tablet Take 10 mg by mouth daily.    [provider]  EPIPEN 2-PAK 0.3 MG/0.3ML SOAJ Inject 0.03 mg into the muscle as needed (allergies.).  03/01/13   [provider]  ergocalciferol (DRISDOL) 8000 UNIT/ML drops Take 3,000 Units by mouth daily.     [provider]  ibuprofen (ADVIL,MOTRIN) 200 MG tablet Take by mouth every 6 (six) hours as needed for fever or mild pain. As directed, per package instructions.    [provider]  loperamide (IMODIUM) 2 MG capsule Take 1 capsule (2 mg total) by mouth 4 (four) times daily as needed for diarrhea or loose stools. 12/29/18   Long, Wonda Olds, MD  Multiple Vitamin (MULTI-VITAMINS) TABS Take 1 tablet by mouth daily.    [provider]  promethazine (PHENERGAN) 25 MG tablet Take 1 tablet (25 mg total) by mouth every 6 (six) hours as needed for nausea or vomiting. 12/29/18   Long, Wonda Olds, MD  Tuberculin-Allergy Syringes (ALLERGY SYRINGE 1CC/27GX1/2") 27G X 1/2" 1 ML MISC 1 each by Does not apply route once a week. Pt stated that she gets allergy injections weekly    [provider]  valACYclovir (VALTREX) 1000 MG tablet Take 0.5 tablets (500 mg total) by mouth daily. 12/14/17   Donnamae Jude, MD    Allergies    Levaquin [levofloxacin], Moxifloxacin, Other, Soy allergy, Sulfur, Wheat bran, Benzocaine, and Benzoyl peroxide-erythromycin  Review of Systems   Review of Systems  Constitutional: Positive for fatigue and fever. Negative for chills.  Eyes: Negative for photophobia and visual disturbance.  Respiratory: Negative for cough and shortness of breath.   Cardiovascular: Negative for  chest pain and palpitations.  Gastrointestinal: Negative for abdominal pain, nausea and vomiting.  Genitourinary: Negative for dysuria and hematuria.  Musculoskeletal: Positive for arthralgias and myalgias.  Skin: Positive for rash. Negative for color change.  Neurological: Negative for seizures and syncope.  Psychiatric/Behavioral: Negative for agitation and confusion.  All other systems reviewed and are negative.   Physical Exam Updated Vital Signs BP 126/79 (  BP Location: Right Arm)   Pulse 79   Temp 98.3 F (36.8 C)   Resp 18   Ht 5\' 7"  (1.702 m)   Wt 84.4 kg   LMP 03/03/2020   SpO2 99%   BMI 29.13 kg/m   Physical Exam Vitals and nursing note reviewed.  Constitutional:      General: She is not in acute distress.    Appearance: She is well-developed.  HENT:     Head: Normocephalic and atraumatic.  Eyes:     Conjunctiva/sclera: Conjunctivae normal.  Cardiovascular:     Rate and Rhythm: Normal rate and regular rhythm.     Pulses: Normal pulses.  Pulmonary:     Effort: Pulmonary effort is normal. No respiratory distress.  Musculoskeletal:     Cervical back: Neck supple.     Comments: Mild tenderness of posterior left popliteal fossa No evident swelling of the left leg  Skin:    General: Skin is warm and dry.     Findings: Rash:       Comments: Small circular erythematous flat lesion on left anterior tibia, approx 4 cm diameter  Neurological:     Mental Status: She is alert.  Psychiatric:        Mood and Affect: Mood normal.        Behavior: Behavior normal.     ED Results / Procedures / Treatments   Labs (all labs ordered are listed, but only abnormal results are displayed) Labs Reviewed - No data to display  EKG None  Radiology No results found.  Procedures Procedures (including critical care time)  Medications Ordered in ED Medications  enoxaparin (LOVENOX) injection 85 mg (85 mg Subcutaneous Given 04/14/20 2156)    ED Course  I have reviewed  the triage vital signs and the nursing notes.  Pertinent labs & imaging results that were available during my care of the patient were reviewed by me and considered in my medical decision making (see chart for details).  39 yo female presenting with left leg swelling and pain for 1 week.  She has a small rash on her left lower leg for which she was started on doxycycline by another EDP today, with lyme tests ordered at another facility.  It's not clear to me that this is lyme but I think this is reasonable for the rash type.  She does not have polyarthralgia (no joint pain or swelling), but has posterior calf/knee tenderness which can be consistent with DVT.  Unfortunately cannot get vascular ultrasound at this time, but we can treat with lovenox and have her return in the morning.  No signs or symptoms of PE at this time.    She is otherwise quite well appearing on exam.  Stable for discharge.   Final Clinical Impression(s) / ED Diagnoses Final diagnoses:  Left leg pain    Rx / DC Orders ED Discharge Orders         Ordered    US Venous Img Lower Unilateral Left     04/14/20 2130           Wyvonnia Dusky, MD 04/14/20 2258

## 2020-04-14 NOTE — ED Triage Notes (Signed)
Pt reports bilateral posterior knee pain that started today. Reports covid dx mid April 2021. Denies SOB.

## 2020-04-15 ENCOUNTER — Other Ambulatory Visit: Payer: Self-pay

## 2020-04-15 ENCOUNTER — Ambulatory Visit (HOSPITAL_BASED_OUTPATIENT_CLINIC_OR_DEPARTMENT_OTHER)
Admission: RE | Admit: 2020-04-15 | Discharge: 2020-04-15 | Disposition: A | Discharge status: Home / Self Care | Payer: BC Managed Care – PPO | Source: Ambulatory Visit | Attending: Emergency Medicine | Admitting: Emergency Medicine

## 2020-04-15 DIAGNOSIS — M7989 Other specified soft tissue disorders: Secondary | ICD-10-CM | POA: Insufficient documentation

## 2020-04-15 DIAGNOSIS — M79605 Pain in left leg: Secondary | ICD-10-CM | POA: Diagnosis not present

## 2020-04-15 DIAGNOSIS — R600 Localized edema: Secondary | ICD-10-CM | POA: Diagnosis not present

## 2020-04-15 NOTE — ED Provider Notes (Signed)
DVT ultrasound ordered last night by Dr. Langston Masker was negative.  Discussed results with patient.  Discussed follow-up with primary care as needed for further evaluation.   Franchot Heidelberg, PA-C 04/15/20 1126    Tegeler, Gwenyth Allegra, MD 04/15/20 1520

## 2020-04-18 ENCOUNTER — Other Ambulatory Visit: Payer: Self-pay | Admitting: Family Medicine

## 2020-04-18 DIAGNOSIS — Z1231 Encounter for screening mammogram for malignant neoplasm of breast: Secondary | ICD-10-CM

## 2020-05-02 ENCOUNTER — Ambulatory Visit
Admission: RE | Admit: 2020-05-02 | Discharge: 2020-05-02 | Disposition: A | Discharge status: Home / Self Care | Payer: BC Managed Care – PPO | Source: Ambulatory Visit | Attending: Family Medicine | Admitting: Family Medicine

## 2020-05-02 ENCOUNTER — Other Ambulatory Visit: Payer: Self-pay

## 2020-05-02 DIAGNOSIS — Z1231 Encounter for screening mammogram for malignant neoplasm of breast: Secondary | ICD-10-CM | POA: Diagnosis not present

## 2020-05-18 DIAGNOSIS — N390 Urinary tract infection, site not specified: Secondary | ICD-10-CM | POA: Diagnosis not present

## 2020-05-18 DIAGNOSIS — R300 Dysuria: Secondary | ICD-10-CM | POA: Diagnosis not present

## 2020-09-09 DIAGNOSIS — Z1152 Encounter for screening for COVID-19: Secondary | ICD-10-CM | POA: Diagnosis not present

## 2020-09-09 DIAGNOSIS — Z03818 Encounter for observation for suspected exposure to other biological agents ruled out: Secondary | ICD-10-CM | POA: Diagnosis not present

## 2021-06-10 ENCOUNTER — Other Ambulatory Visit: Payer: Self-pay | Admitting: Family Medicine

## 2021-06-10 DIAGNOSIS — Z1231 Encounter for screening mammogram for malignant neoplasm of breast: Secondary | ICD-10-CM

## 2021-06-12 ENCOUNTER — Ambulatory Visit
Admission: RE | Admit: 2021-06-12 | Discharge: 2021-06-12 | Disposition: A | Discharge status: Home / Self Care | Payer: 59 | Source: Ambulatory Visit

## 2021-06-12 ENCOUNTER — Other Ambulatory Visit: Payer: Self-pay

## 2021-06-12 DIAGNOSIS — Z1231 Encounter for screening mammogram for malignant neoplasm of breast: Secondary | ICD-10-CM

## 2021-07-10 ENCOUNTER — Other Ambulatory Visit (HOSPITAL_COMMUNITY)
Admission: RE | Admit: 2021-07-10 | Discharge: 2021-07-10 | Disposition: A | Discharge status: Home / Self Care | Payer: 59 | Source: Ambulatory Visit | Attending: Family Medicine | Admitting: Family Medicine

## 2021-07-10 ENCOUNTER — Other Ambulatory Visit: Payer: Self-pay

## 2021-07-10 ENCOUNTER — Ambulatory Visit (INDEPENDENT_AMBULATORY_CARE_PROVIDER_SITE_OTHER): Payer: 59 | Admitting: Family Medicine

## 2021-07-10 ENCOUNTER — Encounter: Payer: Self-pay | Admitting: Family Medicine

## 2021-07-10 VITALS — BP 133/84 | HR 101 | Ht 67.0 in | Wt 211.0 lb

## 2021-07-10 DIAGNOSIS — Z124 Encounter for screening for malignant neoplasm of cervix: Secondary | ICD-10-CM | POA: Diagnosis present

## 2021-07-10 DIAGNOSIS — E034 Atrophy of thyroid (acquired): Secondary | ICD-10-CM

## 2021-07-10 DIAGNOSIS — K900 Celiac disease: Secondary | ICD-10-CM

## 2021-07-10 DIAGNOSIS — N6022 Fibroadenosis of left breast: Secondary | ICD-10-CM

## 2021-07-10 DIAGNOSIS — N898 Other specified noninflammatory disorders of vagina: Secondary | ICD-10-CM

## 2021-07-10 DIAGNOSIS — Z01419 Encounter for gynecological examination (general) (routine) without abnormal findings: Secondary | ICD-10-CM | POA: Diagnosis not present

## 2021-07-10 NOTE — Progress Notes (Signed)
Feels like she has residual yeast infection

## 2021-07-10 NOTE — Progress Notes (Signed)
Subjective:     Catherine Mckenzie is a 40 y.o. female and is here for a comprehensive physical exam. The patient reports no problems. Has had tonsillitis twice this summer and thinks she has a residual yeast infection.  The following portions of the patient's history were reviewed and updated as appropriate: allergies, current medications, past family history, past medical history, past social history, past surgical history, and problem list.  Review of Systems Pertinent items noted in HPI and remainder of comprehensive ROS otherwise negative.   Objective:    BP 133/84   Pulse (!) 101   Ht '5\' 7"'$  (1.702 m)   Wt 211 lb (95.7 kg)   LMP 06/20/2021 (Approximate)   BMI 33.05 kg/m  General appearance: alert, cooperative, and appears stated age Head: Normocephalic, without obvious abnormality, atraumatic Neck: no adenopathy, supple, symmetrical, trachea midline, and thyroid not enlarged, symmetric, no tenderness/mass/nodules Lungs: clear to auscultation bilaterally Breasts: normal appearance, no masses or tenderness Heart: regular rate and rhythm, S1, S2 normal, no murmur, click, rub or gallop Abdomen: soft, non-tender; bowel sounds normal; no masses,  no organomegaly Pelvic: cervix normal in appearance, external genitalia normal, no adnexal masses or tenderness, no cervical motion tenderness, uterus normal size, shape, and consistency, and vagina normal without discharge Extremities: extremities normal, atraumatic, no cyanosis or edema Pulses: 2+ and symmetric Skin: Skin color, texture, turgor normal. No rashes or lesions Lymph nodes: Cervical, supraclavicular, and axillary nodes normal. Neurologic: Grossly normal    Assessment:    Healthy female exam.      Plan:  Encounter for gynecological examination without abnormal finding  Screening for malignant neoplasm of cervix - Plan: Cytology - PAP  Vaginal irritation - Plan: Cervicovaginal ancillary only    Return in 1 year (on  07/10/2022).  See After Visit Summary for Counseling Recommendations

## 2021-07-11 LAB — CERVICOVAGINAL ANCILLARY ONLY
Bacterial Vaginitis (gardnerella): NEGATIVE
Candida Glabrata: POSITIVE — AB
Candida Vaginitis: NEGATIVE
Comment: NEGATIVE
Comment: NEGATIVE
Comment: NEGATIVE

## 2021-07-11 LAB — CYTOLOGY - PAP
Comment: NEGATIVE
Diagnosis: NEGATIVE
High risk HPV: NEGATIVE

## 2021-07-14 MED ORDER — FLUCONAZOLE 150 MG PO TABS: 150.0000 mg | ORAL_TABLET | ORAL | 0 refills | Status: AC

## 2021-07-14 NOTE — Addendum Note (Signed)
Addended by: Donnamae Jude on: 07/14/2021 10:50 AM   Modules accepted: Orders

## 2022-09-17 ENCOUNTER — Other Ambulatory Visit: Payer: Self-pay | Admitting: Family Medicine

## 2022-09-17 DIAGNOSIS — Z1231 Encounter for screening mammogram for malignant neoplasm of breast: Secondary | ICD-10-CM

## 2022-09-21 ENCOUNTER — Ambulatory Visit
Admission: RE | Admit: 2022-09-21 | Discharge: 2022-09-21 | Disposition: A | Discharge status: Home / Self Care | Payer: 59 | Source: Ambulatory Visit

## 2022-09-21 DIAGNOSIS — Z1231 Encounter for screening mammogram for malignant neoplasm of breast: Secondary | ICD-10-CM

## 2023-01-11 ENCOUNTER — Other Ambulatory Visit: Payer: Self-pay

## 2023-01-11 ENCOUNTER — Encounter (HOSPITAL_BASED_OUTPATIENT_CLINIC_OR_DEPARTMENT_OTHER): Payer: Self-pay

## 2023-01-11 ENCOUNTER — Emergency Department (HOSPITAL_BASED_OUTPATIENT_CLINIC_OR_DEPARTMENT_OTHER)
Admission: EM | Admit: 2023-01-11 | Discharge: 2023-01-11 | Disposition: A | Discharge status: Home / Self Care | Payer: 59 | Attending: Emergency Medicine | Admitting: Emergency Medicine

## 2023-01-11 DIAGNOSIS — R42 Dizziness and giddiness: Secondary | ICD-10-CM | POA: Insufficient documentation

## 2023-01-11 DIAGNOSIS — R55 Syncope and collapse: Secondary | ICD-10-CM | POA: Diagnosis not present

## 2023-01-11 LAB — CBC
HCT: 40.9 % (ref 36.0–46.0)
Hemoglobin: 13.8 g/dL (ref 12.0–15.0)
MCH: 31.2 pg (ref 26.0–34.0)
MCHC: 33.7 g/dL (ref 30.0–36.0)
MCV: 92.3 fL (ref 80.0–100.0)
Platelets: 282 10*3/uL (ref 150–400)
RBC: 4.43 MIL/uL (ref 3.87–5.11)
RDW: 12.9 % (ref 11.5–15.5)
WBC: 6.2 10*3/uL (ref 4.0–10.5)
nRBC: 0 % (ref 0.0–0.2)

## 2023-01-11 LAB — BASIC METABOLIC PANEL
Anion gap: 10 (ref 5–15)
BUN: 6 mg/dL (ref 6–20)
CO2: 25 mmol/L (ref 22–32)
Calcium: 9.1 mg/dL (ref 8.9–10.3)
Chloride: 104 mmol/L (ref 98–111)
Creatinine, Ser: 0.79 mg/dL (ref 0.44–1.00)
GFR, Estimated: >60 mL/min (ref >60)
Glucose, Bld: 94 mg/dL (ref 70–99)
Potassium: 3.5 mmol/L (ref 3.5–5.1)
Sodium: 139 mmol/L (ref 135–145)

## 2023-01-11 LAB — MAGNESIUM: Magnesium: 2 mg/dL (ref 1.7–2.4)

## 2023-01-11 LAB — HCG, SERUM, QUALITATIVE: Preg, Serum: NEGATIVE

## 2023-01-11 LAB — CBG MONITORING, ED: Glucose-Capillary: 95 mg/dL (ref 70–99)

## 2023-01-11 LAB — TROPONIN I (HIGH SENSITIVITY): Troponin I (High Sensitivity): <2 ng/L (ref <18)

## 2023-01-11 MED ORDER — DEXTROSE 5 % AND 0.45 % NACL IV BOLUS
1000.0000 mL | Freq: Once | INTRAVENOUS | Status: AC
  Administered 2023-01-11: 1000 mL via INTRAVENOUS

## 2023-01-11 MED ORDER — ONDANSETRON 4 MG PO TBDP: 4.0000 mg | ORAL_TABLET | Freq: Three times a day (TID) | ORAL | 0 refills | Status: AC | PRN

## 2023-01-11 MED ORDER — ONDANSETRON HCL 4 MG/2ML IJ SOLN
4.0000 mg | Freq: Once | INTRAMUSCULAR | Status: AC
  Administered 2023-01-11: 4 mg via INTRAVENOUS
  Filled 2023-01-11: qty 2

## 2023-01-11 NOTE — Discharge Instructions (Signed)
You were seen in the emergency department feeling feeling dizzy lightheaded nauseous after undergoing colonoscopy.  Your lab work was unremarkable.  He felt little bit better after some IV fluids.  Continue to hydrate at home.  We are sending a prescription for nausea medication to your pharmacy.  Follow-up with your regular doctor and return to the emergency department if any worsening or concerning symptoms.

## 2023-01-11 NOTE — ED Provider Notes (Signed)
[facility] Provider Note   CSN: [id number] Arrival date & time: 01/11/23  2014     History {Add pertinent medical, surgical, social history, OB history to HPI:1} Chief Complaint  Patient presents with   Dizziness    Catherine Mckenzie is a 42 y.o. female.  She had a colonoscopy today and since then has felt very lightheaded and nauseous.  She said her blood sugar was in the 60s and improved a little bit with some Coke but she still feels wiped out.  She said they did a few biopsies but otherwise did not have any significant findings.  She has had a colonoscopy before that she said she had a migraine with but otherwise did not feel like this.  No fevers chills.  The history is provided by the patient.  Dizziness Quality:  Lightheadedness Severity:  Moderate Onset quality:  Gradual Timing:  Intermittent Progression:  Unchanged Chronicity:  New Context: standing up   Relieved by:  Nothing Worsened by:  Standing up Ineffective treatments:  Lying down Associated symptoms: diarrhea and nausea   Associated symptoms: no blood in stool, no chest pain, no shortness of breath and no vision changes        Home Medications Prior to Admission medications   Medication Sig Start Date End Date Taking? Authorizing Provider  acetaminophen (TYLENOL) 500 MG tablet Take 1,000 mg by mouth every 6 (six) hours as needed. For migraine    [provider]  albuterol (VENTOLIN HFA) 108 (90 Base) MCG/ACT inhaler Inhale 1-2 puffs into the lungs every 6 (six) hours as needed for wheezing or shortness of breath. 03/10/20   Fawze, Mina A, PA-C  ARMOUR THYROID 60 MG tablet TAKE 2 TABLETS (120 MG TOTAL) BY MOUTH DAILY BEFORE BREAKFAST. 04/04/15   Donnamae Jude, MD  cetirizine (ZYRTEC) 10 MG tablet Take 10 mg by mouth daily.    [provider]  EPIPEN 2-PAK 0.3 MG/0.3ML SOAJ Inject 0.03 mg into the muscle as needed (allergies.).  03/01/13   [provider]  ergocalciferol (DRISDOL) 8000 UNIT/ML drops Take 3,000 Units by mouth daily.  Patient not taking: Reported on 07/10/2021    [provider]  ibuprofen (ADVIL,MOTRIN) 200 MG tablet Take by mouth every 6 (six) hours as needed for fever or mild pain. As directed, per package instructions.    [provider]  Multiple Vitamin (MULTI-VITAMINS) TABS Take 1 tablet by mouth daily.    [provider]  promethazine (PHENERGAN) 25 MG tablet Take 1 tablet (25 mg total) by mouth every 6 (six) hours as needed for nausea or vomiting. 12/29/18   Long, Wonda Old[facility], MD  valACYclovir (VALTREX) 1000 MG tablet Take 0.5 tablets (500 mg total) by mouth daily. 12/14/17   Donnamae Jude, MD      Allergies    Elemental sulfur, Levaquin [levofloxacin], Moxifloxacin, Other, Soy allergy, Wheat bran, Benzocaine, and Benzoyl peroxide-erythromycin    Review of Systems   Review of Systems  Constitutional:  Negative for fever.  Respiratory:  Negative for shortness of breath.   Cardiovascular:  Negative for chest pain.  Gastrointestinal:  Positive for diarrhea and nausea. Negative for blood in stool.  Neurological:  Positive for dizziness and light-headedness.    Physical Exam Updated Vital Signs BP 129/75 (BP Location: Left Arm)   Pulse 93   Temp 97.9 F (36.6 C)   Resp 18   Ht 5' 7"$  (1.702 m)   Wt 96.2 kg  SpO2 100%   BMI 33.20 kg/m  Physical Exam Vitals and nursing note reviewed.  Constitutional:      General: She is not in acute distress.    Appearance: Normal appearance. She is well-developed.  HENT:     Head: Normocephalic and atraumatic.  Eyes:     Conjunctiva/sclera: Conjunctivae normal.  Cardiovascular:     Rate and Rhythm: Normal rate and regular rhythm.     Heart sounds: No murmur heard. Pulmonary:     Effort: Pulmonary effort is normal. No respiratory distress.     Breath sounds: Normal breath sounds.  Abdominal:     Palpations: Abdomen is soft.      Tenderness: There is no abdominal tenderness. There is no guarding or rebound.  Musculoskeletal:        General: No deformity.     Cervical back: Neck supple.  Skin:    General: Skin is warm and dry.     Capillary Refill: Capillary refill takes less than 2 seconds.  Neurological:     General: No focal deficit present.     Mental Status: She is alert.     ED Results / Procedures / Treatments   Labs (all labs ordered are listed, but only abnormal results are displayed) Labs Reviewed  BASIC METABOLIC PANEL  CBC  HCG, SERUM, QUALITATIVE  MAGNESIUM  CBG MONITORING, ED  TROPONIN I (HIGH SENSITIVITY)    EKG EKG Interpretation  Date/Time:  Monday January 11 2023 20:24:09 EST Ventricular Rate:  89 PR Interval:  122 QRS Duration: 82 QT Interval:  484 QTC Calculation: 588 R Axis:   71 Text Interpretation: Normal sinus rhythm Prolonged QT Abnormal ECG No previous ECGs available Confirmed by Aletta Edouard [phone]) on 01/11/2023 8:27:03 PM  Radiology No results found.  Procedures Procedures  {Document cardiac monitor, telemetry assessment procedure when appropriate:1}  Medications Ordered in ED Medications  dextrose 5 % and 0.45% NaCl 5-0.45 % bolus 1,000 mL (has no administration in time range)  ondansetron (ZOFRAN) injection 4 mg (has no administration in time range)    ED Course/ Medical Decision Making/ A&P   {   Click here for ABCD2, HEART and other calculatorsREFRESH Note before signing :1}                          Medical Decision Making Amount and/or Complexity of Data Reviewed Labs: ordered.  Risk Prescription drug management.   This patient complains of ***; this involves an extensive number of treatment Options and is a complaint that carries with it a high risk of complications and morbidity. The differential includes ***  I ordered, reviewed and interpreted labs, which included *** I ordered medication *** and reviewed PMP when indicated. I ordered  imaging studies which included *** and I independently    visualized and interpreted imaging which showed *** Additional history obtained from *** Previous records obtained and reviewed *** I consulted *** and discussed lab and imaging findings and discussed disposition.  Cardiac monitoring reviewed, *** Social determinants considered, *** Critical Interventions: ***  After the interventions stated above, I reevaluated the patient and found *** Admission and further testing considered, ***   {Document critical care time when appropriate:1} {Document review of labs and clinical decision tools ie heart score, Chads2Vasc2 etc:1}  {Document your independent review of radiology images, and any outside records:1} {Document your discussion with family members, caretakers, and with consultants:1} {Document social determinants of health affecting pt's care:1} {Document  your decision making why or why not admission, treatments were needed:1} Final Clinical Impression(s) / ED Diagnoses Final diagnoses:  None    Rx / DC Orders ED Discharge Orders     None

## 2023-01-11 NOTE — ED Triage Notes (Signed)
Patient here POV from Home.  Had Routine Colonoscopy completed today at 1430. Felt "terrible" and faint upon awakening. CBG was low at that time. Endorses Associated Nausea, Anxiety, CP.   NAD Noted during Triage. A&Ox4. Gcs 15. Ambulatory

## 2023-09-29 ENCOUNTER — Other Ambulatory Visit: Payer: Self-pay | Admitting: Family Medicine

## 2023-09-29 DIAGNOSIS — Z1231 Encounter for screening mammogram for malignant neoplasm of breast: Secondary | ICD-10-CM

## 2023-10-04 ENCOUNTER — Ambulatory Visit
Admission: RE | Admit: 2023-10-04 | Discharge: 2023-10-04 | Disposition: A | Discharge status: Home / Self Care | Payer: 59 | Source: Ambulatory Visit

## 2023-10-04 DIAGNOSIS — Z1231 Encounter for screening mammogram for malignant neoplasm of breast: Secondary | ICD-10-CM

## 2024-09-12 ENCOUNTER — Other Ambulatory Visit: Payer: Self-pay | Admitting: Family Medicine

## 2024-09-12 DIAGNOSIS — Z1231 Encounter for screening mammogram for malignant neoplasm of breast: Secondary | ICD-10-CM

## 2024-10-04 ENCOUNTER — Ambulatory Visit: Admission: RE | Admit: 2024-10-04 | Discharge: 2024-10-04 | Disposition: A | Source: Ambulatory Visit

## 2024-10-04 DIAGNOSIS — Z1231 Encounter for screening mammogram for malignant neoplasm of breast: Secondary | ICD-10-CM
